# Patient Record
Sex: Female | Born: 1975 | Race: White | Hispanic: No | Marital: Married | Smoking: Never smoker
Health system: Southern US, Community
[De-identification: ages and names within clinical notes are randomized; demographics above are authoritative.]

## PROBLEM LIST (undated history)

## (undated) DIAGNOSIS — I10 Essential (primary) hypertension: Secondary | ICD-10-CM

## (undated) DIAGNOSIS — F419 Anxiety disorder, unspecified: Secondary | ICD-10-CM

## (undated) DIAGNOSIS — E0590 Thyrotoxicosis, unspecified without thyrotoxic crisis or storm: Secondary | ICD-10-CM

## (undated) DIAGNOSIS — K219 Gastro-esophageal reflux disease without esophagitis: Secondary | ICD-10-CM

## (undated) DIAGNOSIS — J0301 Acute recurrent streptococcal tonsillitis: Secondary | ICD-10-CM

## (undated) HISTORY — DX: Thyrotoxicosis, unspecified without thyrotoxic crisis or storm: E05.90

## (undated) HISTORY — DX: Anxiety disorder, unspecified: F41.9

## (undated) HISTORY — DX: Gastro-esophageal reflux disease without esophagitis: K21.9

---
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
## 1998-06-24 HISTORY — PX: DILATION AND CURETTAGE OF UTERUS: SHX78

## 2010-06-24 HISTORY — PX: OTHER SURGICAL HISTORY: SHX169

## 2013-11-17 ENCOUNTER — Encounter: Payer: Self-pay | Admitting: Family

## 2013-11-17 ENCOUNTER — Ambulatory Visit (INDEPENDENT_AMBULATORY_CARE_PROVIDER_SITE_OTHER): Payer: Managed Care, Other (non HMO) | Admitting: Family

## 2013-11-17 VITALS — BP 110/80 | HR 67 | Temp 98.2°F | Resp 16 | Ht <= 58 in | Wt 202.0 lb

## 2013-11-17 DIAGNOSIS — F40243 Fear of flying: Secondary | ICD-10-CM

## 2013-11-17 DIAGNOSIS — F40298 Other specified phobia: Secondary | ICD-10-CM

## 2013-11-17 MED ORDER — ALPRAZOLAM 0.25 MG PO TABS: ORAL_TABLET | ORAL | Status: DC

## 2013-11-17 NOTE — Assessment & Plan Note (Signed)
Well controlled with use of PRN xanax. Refill provided today. A controlled substance contract was reviewed and signed today.

## 2013-11-17 NOTE — Progress Notes (Signed)
   Subjective:    Patient ID: Daisey Must, female    DOB: September 11, 1975, 38 y.o.   MRN: [id number]  HPI  Ms. Torrez is a 38 yr old female who presents today to establish care. She recently moved to the area from [place]. 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 She has phobia of flying and uses xanax prior to long flights. Notes that if she is on a short flight, she does not always need to use xanax. She denies any issues with anxiety outside of flying. Denies hx of depression. Reports that her last physical was <1 year ago back in [place].   Review of Systems  Constitutional: Negative for unexpected weight change.  HENT: Negative for hearing loss and postnasal drip.   Eyes: Negative for visual disturbance.  Respiratory: Negative for cough and shortness of breath.   Cardiovascular: Negative for chest pain.  Gastrointestinal: Negative for vomiting, diarrhea and constipation.  Genitourinary: Negative for dysuria.       Mirena for bleeding issue  Skin: Negative for rash.  Neurological: Negative for headaches.  Hematological: Negative for adenopathy.  Psychiatric/Behavioral:       Denies depression   Past Medical History  Diagnosis Date  . Anxiety     History   Social History  . Marital Status: Married    Spouse Name: N/A    Number of Children: N/A  . Years of Education: N/A   Occupational History  . Not on file.   Social History Main Topics  . Smoking status: Never Smoker   . Smokeless tobacco: Never Used  . Alcohol Use: 1.0 oz/week    2 drink(s) per week  . Drug Use: Not on file  . Sexual Activity: Not on file   Other Topics Concern  . Not on file   Social History Narrative   Lives with husband   No children   Enjoys television, sporting events   Works as Charity fundraiser for [employer]   1 cat   Grew up in [place], has lived all over the country and also in [place] due to job moves.    Past Surgical History  Procedure Laterality Date  .  Wisdom tooth extraction  1995  . Dilation and curettage of uterus  2000  . Lasix  2012    History reviewed. No pertinent family history.  No Known Allergies  No current outpatient prescriptions on file prior to visit.   No current facility-administered medications on file prior to visit.    BP 110/80  Pulse 67  Temp(Src) 98.2 F (36.8 C) (Oral)  Resp 16  Ht 4\' 10"  (1.473 m)  Wt 202 lb (91.627 kg)  BMI 42.23 kg/m2  SpO2 99%       Objective:   Physical Exam        Assessment & Plan:

## 2013-11-17 NOTE — Progress Notes (Signed)
Pre visit review using our clinic review tool, if applicable. No additional management support is needed unless otherwise documented below in the visit note. 

## 2013-11-17 NOTE — Patient Instructions (Signed)
Please follow up in 6 months for annual physical. Welcome to [place]!

## 2014-05-01 ENCOUNTER — Telehealth: Payer: Self-pay | Admitting: Family

## 2014-05-02 NOTE — Telephone Encounter (Signed)
Last alprazolam Rx 11/17/13, #30. Pt is due for fasting physical at the end of November but has not scheduled yet.  Please advise.

## 2014-05-02 NOTE — Telephone Encounter (Signed)
Patient calling in requesting this. °

## 2014-05-02 NOTE — Telephone Encounter (Signed)
OK to send refill for 30 tabs.

## 2014-05-03 MED ORDER — ALPRAZOLAM 0.25 MG PO TABS: ORAL_TABLET | ORAL | Status: DC

## 2014-05-03 NOTE — Telephone Encounter (Signed)
Rx called to pharmacy voicemail. 

## 2014-08-15 ENCOUNTER — Telehealth: Payer: Self-pay | Admitting: Family

## 2014-08-15 MED ORDER — ALPRAZOLAM 0.25 MG PO TABS: ORAL_TABLET | ORAL | Status: DC

## 2014-08-15 NOTE — Telephone Encounter (Signed)
Rx called to pharmacy voicemail. 

## 2014-08-15 NOTE — Telephone Encounter (Signed)
Last Rx 05/03/14. Rx printed and forwarded to Provider for signature.

## 2014-08-15 NOTE — Telephone Encounter (Signed)
OK to send refill.  She is due for physical.  Please schedule.

## 2014-08-24 ENCOUNTER — Ambulatory Visit (INDEPENDENT_AMBULATORY_CARE_PROVIDER_SITE_OTHER): Payer: Managed Care, Other (non HMO) | Admitting: Family

## 2014-08-24 ENCOUNTER — Encounter: Payer: Self-pay | Admitting: Family

## 2014-08-24 VITALS — BP 110/80 | HR 92 | Temp 98.0°F | Resp 16 | Ht <= 58 in | Wt 213.6 lb

## 2014-08-24 DIAGNOSIS — K219 Gastro-esophageal reflux disease without esophagitis: Secondary | ICD-10-CM

## 2014-08-24 MED ORDER — RANITIDINE HCL 150 MG PO TABS: 150.0000 mg | ORAL_TABLET | Freq: Two times a day (BID) | ORAL | Status: DC

## 2014-08-24 NOTE — Patient Instructions (Addendum)
Start ranitidine, call if symptoms worsen or do not improve. Work on diet recommendations as below.  Gastroesophageal Reflux Disease, Adult Gastroesophageal reflux disease (GERD) happens when acid from your stomach flows up into the esophagus. When acid comes in contact with the esophagus, the acid causes soreness (inflammation) in the esophagus. Over time, GERD may create small holes (ulcers) in the lining of the esophagus. CAUSES   Increased body weight. This puts pressure on the stomach, making acid rise from the stomach into the esophagus.  Smoking. This increases acid production in the stomach.  Drinking alcohol. This causes decreased pressure in the lower esophageal sphincter (valve or ring of muscle between the esophagus and stomach), allowing acid from the stomach into the esophagus.  Late evening meals and a full stomach. This increases pressure and acid production in the stomach.  A malformed lower esophageal sphincter. Sometimes, no cause is found. SYMPTOMS   Burning pain in the lower part of the mid-chest behind the breastbone and in the mid-stomach area. This may occur twice a week or more often.  Trouble swallowing.  Sore throat.  Dry cough.  Asthma-like symptoms including chest tightness, shortness of breath, or wheezing. DIAGNOSIS  Your caregiver may be able to diagnose GERD based on your symptoms. In some cases, X-rays and other tests may be done to check for complications or to check the condition of your stomach and esophagus. TREATMENT  Your caregiver may recommend over-the-counter or prescription medicines to help decrease acid production. Ask your caregiver before starting or adding any new medicines.  HOME CARE INSTRUCTIONS   Change the factors that you can control. Ask your caregiver for guidance concerning weight loss, quitting smoking, and alcohol consumption.  Avoid foods and drinks that make your symptoms worse, such as:  Caffeine or alcoholic  drinks.  Chocolate.  Peppermint or mint flavorings.  Garlic and onions.  Spicy foods.  Citrus fruits, such as oranges, lemons, or limes.  Tomato-based foods such as sauce, chili, salsa, and pizza.  Fried and fatty foods.  Avoid lying down for the 3 hours prior to your bedtime or prior to taking a nap.  Eat small, frequent meals instead of large meals.  Wear loose-fitting clothing. Do not wear anything tight around your waist that causes pressure on your stomach.  Raise the head of your bed 6 to 8 inches with wood blocks to help you sleep. Extra pillows will not help.  Only take over-the-counter or prescription medicines for pain, discomfort, or fever as directed by your caregiver.  Do not take aspirin, ibuprofen, or other nonsteroidal anti-inflammatory drugs (NSAIDs). SEEK IMMEDIATE MEDICAL CARE IF:   You have pain in your arms, neck, jaw, teeth, or back.  Your pain increases or changes in intensity or duration.  You develop nausea, vomiting, or sweating (diaphoresis).  You develop shortness of breath, or you faint.  Your vomit is green, yellow, black, or looks like coffee grounds or blood.  Your stool is red, bloody, or black. These symptoms could be signs of other problems, such as heart disease, gastric bleeding, or esophageal bleeding. MAKE SURE YOU:   Understand these instructions.  Will watch your condition.  Will get help right away if you are not doing well or get worse. Document Released: 03/20/2005 Document Revised: 09/02/2011 Document Reviewed: 12/28/2010 [facility] Patient Information 2015 [place], [place]. This information is not intended to replace advice given to you by your health care provider. Make sure you discuss any questions you have with your health care  provider.  

## 2014-08-24 NOTE — Assessment & Plan Note (Signed)
Discussed dietary changes to help gerd. Trial of ranitidine. Discussed red flags such as black/blood stools or coffee ground emesis- she should let us know if these symptoms occur. Pt verbalizes understanding.

## 2014-08-24 NOTE — Progress Notes (Signed)
   Subjective:    Patient ID: Lori Stone, female    DOB: 02/26/1976, 39 y.o.   MRN: [id number]  HPI  GERD- sx x 3 weeks. Has chief complaint "burning sensation". Improved with tums.  + associated belching, mild dysphagia x 2 days. Denies associated cough, fever, chills, hematemesis, black/bloody stools.    Review of Systems See HPI  Past Medical History  Diagnosis Date  . Anxiety     History   Social History  . Marital Status: Married    Spouse Name: N/A  . Number of Children: N/A  . Years of Education: N/A   Occupational History  . Not on file.   Social History Main Topics  . Smoking status: Never Smoker   . Smokeless tobacco: Never Used  . Alcohol Use: 1.0 oz/week    2 drink(s) per week  . Drug Use: Not on file  . Sexual Activity: Not on file   Other Topics Concern  . 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    Past Surgical History  Procedure Laterality Date  . Wisdom tooth extraction  1995  . Dilation and curettage of uterus  2000  . Lasix  2012    History reviewed. No pertinent family history.  No Known Allergies  Current Outpatient Prescriptions on File Prior to Visit  Medication Sig Dispense Refill  . ALPRAZolam (XANAX) 0.25 MG tablet Take 1 tablet as needed before flying 30 tablet 0   No current facility-administered medications on file prior to visit.    BP 110/80 mmHg  Pulse 92  Temp(Src) 98 F (36.7 C) (Oral)  Resp 16  Ht 4\' 10"  (1.473 m)  Wt 213 lb 9.6 oz (96.888 kg)  BMI 44.65 kg/m2  SpO2 99%       Objective:   Physical Exam  Constitutional: She appears well-developed and well-nourished.  Cardiovascular: Normal rate, regular rhythm and normal heart sounds.   No murmur heard. Pulmonary/Chest: Effort normal and  breath sounds normal. No respiratory distress. She has no wheezes.  Abdominal: Soft. She exhibits no distension. There is no tenderness.  Psychiatric: She has a normal mood and affect. Her behavior is normal. Judgment and thought content normal.          Assessment & Plan:

## 2014-10-14 ENCOUNTER — Ambulatory Visit (INDEPENDENT_AMBULATORY_CARE_PROVIDER_SITE_OTHER): Payer: Managed Care, Other (non HMO) | Admitting: Family

## 2014-10-14 ENCOUNTER — Encounter: Payer: Self-pay | Admitting: Internal Medicine

## 2014-10-14 ENCOUNTER — Encounter: Payer: Self-pay | Admitting: Family

## 2014-10-14 VITALS — BP 116/85 | HR 69 | Temp 97.7°F | Resp 16 | Ht 67.75 in | Wt 217.0 lb

## 2014-10-14 DIAGNOSIS — K219 Gastro-esophageal reflux disease without esophagitis: Secondary | ICD-10-CM | POA: Diagnosis not present

## 2014-10-14 DIAGNOSIS — R140 Abdominal distension (gaseous): Secondary | ICD-10-CM | POA: Diagnosis not present

## 2014-10-14 NOTE — Patient Instructions (Signed)
Please complete lab work prior to leaving. You will be contact about your referral.  Please let us know if you have not heard back within 1 week about your referral.

## 2014-10-14 NOTE — Progress Notes (Signed)
   Subjective:    Patient ID: Lori Stone, female    DOB: 10/24/1975, 39 y.o.   MRN: [id number]  HPI  Lori Stone is a 39 yr old female who presents today to discuss celiac testing. Bloating, diarrhea, worse after breads and cheeses.  Mother and Earlie RavelingGreat Aunt (maternal) both have celiac disease.    Lori RifeGerd- notes some improvement in her gerd sxs with zantac.   Review of Systems See HPI  Past Medical History  Diagnosis Date  . Anxiety     History   Social History  . Marital Status: Married    Spouse Name: N/A  . Number of Children: N/A  . Years of Education: N/A   Occupational History  . Not on file.   Social History Main Topics  . Smoking status: Never Smoker   . Smokeless tobacco: Never Used  . Alcohol Use: 1.0 oz/week    2 drink(s) per week  . Drug Use: Not on file  . Sexual Activity: Not on file   Other Topics Concern  . 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    Past Surgical History  Procedure Laterality Date  . Wisdom tooth extraction  1995  . Dilation and curettage of uterus  2000  . Lasix  2012    No family history on file.  No Known Allergies  Current Outpatient Prescriptions on File Prior to Visit  Medication Sig Dispense Refill  . ALPRAZolam (XANAX) 0.25 MG tablet Take 1 tablet as needed before flying 30 tablet 0  . ranitidine (ZANTAC) 150 MG tablet Take 1 tablet (150 mg total) by mouth 2 (two) times daily. 60 tablet 2   No current facility-administered medications on file prior to visit.    BP 116/85 mmHg  Pulse 69  Temp(Src) 97.7 F (36.5 C) (Oral)  Resp 16  Ht 5' 7.75" (1.721 m)  Wt 217 lb (98.431 kg)  BMI 33.23 kg/m2  SpO2 99%       Objective:   Physical Exam  Constitutional: She is oriented to person, place, and  time. She appears well-developed and well-nourished.  Cardiovascular: Normal rate, regular rhythm and normal heart sounds.   No murmur heard. Pulmonary/Chest: Effort normal and breath sounds normal. No respiratory distress. She has no wheezes.  Abdominal: Soft. Bowel sounds are normal. She exhibits no distension. There is no tenderness. There is no rebound.  Neurological: She is alert and oriented to person, place, and time.  Psychiatric: She has a normal mood and affect. Her behavior is normal. Judgment and thought content normal.          Assessment & Plan:

## 2014-10-14 NOTE — Assessment & Plan Note (Signed)
Some improvement with zantac, continue same.

## 2014-10-14 NOTE — Assessment & Plan Note (Signed)
New. ? Celiac dz.  Pt desires testing. Obtain celiac panel, refer to GI for further evaluation due to gerd, bloating, diarrhea.

## 2014-10-14 NOTE — Progress Notes (Signed)
Pre visit review using our clinic review tool, if applicable. No additional management support is needed unless otherwise documented below in the visit note. 

## 2014-10-15 ENCOUNTER — Encounter: Payer: Self-pay | Admitting: Family

## 2014-10-16 ENCOUNTER — Encounter: Payer: Self-pay | Admitting: Family

## 2014-10-17 LAB — TISSUE TRANSGLUTAMINASE, IGA: Tissue Transglutaminase Ab, IgA: 1 U/mL (ref <4)

## 2014-10-17 LAB — GLIADIN ANTIBODIES, SERUM
Gliadin IgA: 9 Units (ref <20)
Gliadin IgG: 1 Units (ref <20)

## 2014-10-17 NOTE — Telephone Encounter (Signed)
Victorino DikeJennifer, could you please try [facility]. Elnoria HowardHung [facility] see if we can get her in sooner (see mychart note) If not, please notify pt.

## 2014-10-18 ENCOUNTER — Encounter: Payer: Self-pay | Admitting: Family

## 2014-10-18 LAB — RETICULIN ANTIBODIES, IGA W TITER: Reticulin Ab, IgA: NEGATIVE

## 2014-10-28 ENCOUNTER — Encounter: Payer: Self-pay | Admitting: Family

## 2014-11-02 ENCOUNTER — Ambulatory Visit: Payer: Managed Care, Other (non HMO) | Admitting: Physician Assistant

## 2014-11-02 ENCOUNTER — Encounter: Payer: Self-pay | Admitting: Gastroenterology

## 2014-11-04 ENCOUNTER — Encounter: Payer: Self-pay | Admitting: Physician Assistant

## 2014-11-04 ENCOUNTER — Ambulatory Visit (INDEPENDENT_AMBULATORY_CARE_PROVIDER_SITE_OTHER): Payer: Managed Care, Other (non HMO) | Admitting: Physician Assistant

## 2014-11-04 ENCOUNTER — Other Ambulatory Visit (INDEPENDENT_AMBULATORY_CARE_PROVIDER_SITE_OTHER): Payer: Managed Care, Other (non HMO)

## 2014-11-04 ENCOUNTER — Other Ambulatory Visit: Payer: Self-pay | Admitting: Family

## 2014-11-04 ENCOUNTER — Encounter: Payer: Self-pay | Admitting: Family

## 2014-11-04 VITALS — BP 118/78 | HR 66 | Ht 67.75 in | Wt 220.4 lb

## 2014-11-04 DIAGNOSIS — R1013 Epigastric pain: Secondary | ICD-10-CM | POA: Diagnosis not present

## 2014-11-04 DIAGNOSIS — R140 Abdominal distension (gaseous): Secondary | ICD-10-CM

## 2014-11-04 DIAGNOSIS — K219 Gastro-esophageal reflux disease without esophagitis: Secondary | ICD-10-CM

## 2014-11-04 DIAGNOSIS — R1310 Dysphagia, unspecified: Secondary | ICD-10-CM

## 2014-11-04 LAB — HEPATIC FUNCTION PANEL
ALT: 16 U/L (ref 0–35)
AST: 14 U/L (ref 0–37)
Albumin: 4.1 g/dL (ref 3.5–5.2)
Alkaline Phosphatase: 47 U/L (ref 39–117)
Bilirubin, Direct: 0.1 mg/dL (ref 0.0–0.3)
Total Bilirubin: 0.4 mg/dL (ref 0.2–1.2)
Total Protein: 7.2 g/dL (ref 6.0–8.3)

## 2014-11-04 LAB — CBC WITH DIFFERENTIAL/PLATELET
Basophils Absolute: 0 10*3/uL (ref 0.0–0.1)
Basophils Relative: 0.4 % (ref 0.0–3.0)
Eosinophils Absolute: 0.2 10*3/uL (ref 0.0–0.7)
Eosinophils Relative: 1.9 % (ref 0.0–5.0)
HCT: 42.1 % (ref 36.0–46.0)
Hemoglobin: 14.5 g/dL (ref 12.0–15.0)
Lymphocytes Relative: 33.4 % (ref 12.0–46.0)
Lymphs Abs: 3 10*3/uL (ref 0.7–4.0)
MCHC: 34.4 g/dL (ref 30.0–36.0)
MCV: 87 fl (ref 78.0–100.0)
Monocytes Absolute: 0.5 10*3/uL (ref 0.1–1.0)
Monocytes Relative: 5.4 % (ref 3.0–12.0)
Neutro Abs: 5.2 10*3/uL (ref 1.4–7.7)
Neutrophils Relative %: 58.9 % (ref 43.0–77.0)
Platelets: 258 10*3/uL (ref 150.0–400.0)
RBC: 4.84 Mil/uL (ref 3.87–5.11)
RDW: 13.4 % (ref 11.5–15.5)
WBC: 8.9 10*3/uL (ref 4.0–10.5)

## 2014-11-04 LAB — LIPASE: Lipase: 18 U/L (ref 11.0–59.0)

## 2014-11-04 MED ORDER — PANTOPRAZOLE SODIUM 40 MG PO TBEC: 40.0000 mg | DELAYED_RELEASE_TABLET | ORAL | Status: DC

## 2014-11-04 MED ORDER — RANITIDINE HCL 150 MG PO TABS: 150.0000 mg | ORAL_TABLET | Freq: Two times a day (BID) | ORAL | Status: DC

## 2014-11-04 NOTE — Patient Instructions (Addendum)
You have been scheduled for an abdominal ultrasound at [facility] (1st floor of hospital) on Thursday, 11/10/14 at 8:30 am. Please arrive 15 minutes prior to your appointment for registration. Make certain not to have anything to eat or drink 6 hours prior to your appointment. Should you need to reschedule your appointment, please contact radiology at [phone]. This test typically takes about 30 minutes to perform.  You have been scheduled for an endoscopy. Please follow written instructions given to you at your visit today. If you use inhalers (even only as needed), please bring them with you on the day of your procedure. Your physician has requested that you go to www.startemmi.com and enter the access code given to you at your visit today. This web site gives a general overview about your procedure. However, you should still follow specific instructions given to you by our office regarding your preparation for the procedure.  Your physician has requested that you go to the basement for the following lab work before leaving today: CBC, Lipase, Hepatic function panel  Patient advised to avoid spicy, acidic, citrus, chocolate, mints, fruit and fruit juices.  Limit the intake of caffeine, alcohol and Soda.  Don't exercise too soon after eating.  Don't lie down within 3-4 hours of eating.  Elevate the head of your bed.  Food Choices for Gastroesophageal Reflux Disease When you have gastroesophageal reflux disease (GERD), the foods you eat and your eating habits are very important. Choosing the right foods can help ease the discomfort of GERD. WHAT GENERAL GUIDELINES DO I NEED TO FOLLOW?  Choose fruits, vegetables, whole grains, low-fat dairy products, and low-fat meat, fish, and poultry.  Limit fats such as oils, salad dressings, butter, nuts, and avocado.  Keep a food diary to identify foods that cause symptoms.  Avoid foods that cause reflux. These may be different for different  people.  Eat frequent small meals instead of three large meals each day.  Eat your meals slowly, in a relaxed setting.  Limit fried foods.  Cook foods using methods other than frying.  Avoid drinking alcohol.  Avoid drinking large amounts of liquids with your meals.  Avoid bending over or lying down until 2-3 hours after eating. WHAT FOODS ARE NOT RECOMMENDED? The following are some foods and drinks that may worsen your symptoms: Vegetables Tomatoes. Tomato juice. Tomato and spaghetti sauce. Chili peppers. Onion and garlic. Horseradish. Fruits Oranges, grapefruit, and lemon (fruit and juice). Meats High-fat meats, fish, and poultry. This includes hot dogs, ribs, ham, sausage, salami, and bacon. Dairy Whole milk and chocolate milk. Sour cream. Cream. Butter. Ice cream. Cream cheese.  Beverages Coffee and tea, with or without caffeine. Carbonated beverages or energy drinks. Condiments Hot sauce. Barbecue sauce.  Sweets/Desserts Chocolate and cocoa. Donuts. Peppermint and spearmint. Fats and Oils High-fat foods, including [place]French fries and potato chips. Other Vinegar. Strong spices, such as black pepper, white pepper, red pepper, cayenne, curry powder, cloves, ginger, and chili powder. The items listed above may not be a complete list of foods and beverages to avoid. Contact your dietitian for more information. Document Released: 06/10/2005 Document Revised: 06/15/2013 Document Reviewed: 04/14/2013 [facility] Patient Information 2015 [place], [place]. This information is not intended to replace advice given to you by your health care provider. Make sure you discuss any questions you have with your health care provider.  Gastroesophageal Reflux Disease, Adult Gastroesophageal reflux disease (GERD) happens when acid from your stomach flows up into the esophagus. When acid comes in  contact with the esophagus, the acid causes soreness (inflammation) in the esophagus. Over time, GERD may  create small holes (ulcers) in the lining of the esophagus. CAUSES   Increased body weight. This puts pressure on the stomach, making acid rise from the stomach into the esophagus.  Smoking. This increases acid production in the stomach.  Drinking alcohol. This causes decreased pressure in the lower esophageal sphincter (valve or ring of muscle between the esophagus and stomach), allowing acid from the stomach into the esophagus.  Late evening meals and a full stomach. This increases pressure and acid production in the stomach.  A malformed lower esophageal sphincter. Sometimes, no cause is found. SYMPTOMS   Burning pain in the lower part of the mid-chest behind the breastbone and in the mid-stomach area. This may occur twice a week or more often.  Trouble swallowing.  Sore throat.  Dry cough.  Asthma-like symptoms including chest tightness, shortness of breath, or wheezing. DIAGNOSIS  Your caregiver may be able to diagnose GERD based on your symptoms. In some cases, X-rays and other tests may be done to check for complications or to check the condition of your stomach and esophagus. TREATMENT  Your caregiver may recommend over-the-counter or prescription medicines to help decrease acid production. Ask your caregiver before starting or adding any new medicines.  HOME CARE INSTRUCTIONS   Change the factors that you can control. Ask your caregiver for guidance concerning weight loss, quitting smoking, and alcohol consumption.  Avoid foods and drinks that make your symptoms worse, such as:  Caffeine or alcoholic drinks.  Chocolate.  Peppermint or mint flavorings.  Garlic and onions.  Spicy foods.  Citrus fruits, such as oranges, lemons, or limes.  Tomato-based foods such as sauce, chili, salsa, and pizza.  Fried and fatty foods.  Avoid lying down for the 3 hours prior to your bedtime or prior to taking a nap.  Eat small, frequent meals instead of large meals.  Wear  loose-fitting clothing. Do not wear anything tight around your waist that causes pressure on your stomach.  Raise the head of your bed 6 to 8 inches with wood blocks to help you sleep. Extra pillows will not help.  Only take over-the-counter or prescription medicines for pain, discomfort, or fever as directed by your caregiver.  Do not take aspirin, ibuprofen, or other nonsteroidal anti-inflammatory drugs (NSAIDs). SEEK IMMEDIATE MEDICAL CARE IF:   You have pain in your arms, neck, jaw, teeth, or back.  Your pain increases or changes in intensity or duration.  You develop nausea, vomiting, or sweating (diaphoresis).  You develop shortness of breath, or you faint.  Your vomit is green, yellow, black, or looks like coffee grounds or blood.  Your stool is red, bloody, or black. These symptoms could be signs of other problems, such as heart disease, gastric bleeding, or esophageal bleeding. MAKE SURE YOU:   Understand these instructions.  Will watch your condition.  Will get help right away if you are not doing well or get worse. Document Released: 03/20/2005 Document Revised: 09/02/2011 Document Reviewed: 12/28/2010 [facility] Patient Information 2015 [place], [place]. This information is not intended to replace advice given to you by your health care provider. Make sure you discuss any questions you have with your health care provider.

## 2014-11-04 NOTE — Progress Notes (Signed)
Patient ID: Lori Stone, female   DOB: 08/15/1975, 39 y.o.   MRN: [id number]    HPI:  Lori MustMarcia G Stone is a 39 y.o.   female referred by Lori Stone, [name], NP for evaluation of postprandial bloating, heartburn, and epigastric discomfort.  Lori Stone is a delightful 39 year old female who works in [employer] for [employer] when [employer] and frequently flies all over the country. Over the past year, she has been traveling much more frequently and has a severe read diet. Over the past 3-6 months she has started having heartburn on a daily basis with frequent belching and burping. She was given a trial of Zantac and states that provides minimal to moderate relief, but if she doesn't take it at all she will have a lot of heartburn. She has been getting nocturnal regurgitation and occasionally has a nocturnal cough. Her throat is very phlegmy in the morning and she has to clear her throat several times in the morning when she first gets up. She has had several episodes of dysphagia to solids and liquids though these are very infrequent. She feels the dysphagia to liquids is more bothersome. She has no globus sensation. She reports multiple episodes after meals with epigastric pain and distention where she feels a tightness around her rib cage it is not associated with nausea or vomiting. Often times she has to have an urgent bowel movement 30-60 minutes after meals. She has not noticed any difference with a limited eating dairy. Overall she's had no change in her bowel habits or stool caliber. She's had no bloody or tarry stools. She was evaluated by her primary care physician and had celiac blood work done which was negative. She states her great aunt on the maternal side has celiac disease and her mother had celiac disease and reports that their blood work was negative but they were diagnosed with celiac anyway. She is not sure if they had small bowel biopsies.   Past Medical History  Diagnosis Date  . Anxiety   . GERD  (gastroesophageal reflux disease)     Past Surgical History  Procedure Laterality Date  . Wisdom tooth extraction  1995  . Dilation and curettage of uterus  2000  . Lasix Bilateral 2012   Family History  Problem Relation Age of Onset  . Breast cancer Maternal Grandmother     dx in her late 10870's  . Ovarian cancer Mother     dx age 10466  . Celiac disease      mom's side, great aunt  . Celiac disease Mother   . Colon cancer Neg Hx    History  Substance Use Topics  . Smoking status: Never Smoker   . Smokeless tobacco: Never Used  . Alcohol Use: 1.2 oz/week    2 Standard drinks or equivalent per week     Comment: social   Current Outpatient Prescriptions  Medication Sig Dispense Refill  . ALPRAZolam (XANAX) 0.25 MG tablet Take 1 tablet as needed before flying 30 tablet 0  . ranitidine (ZANTAC) 150 MG tablet Take 1 tablet (150 mg total) by mouth 2 (two) times daily. 60 tablet 5  . pantoprazole (PROTONIX) 40 MG tablet Take 1 tablet (40 mg total) by mouth every morning. 30 minutes prior to breakfast 30 tablet 6   No current facility-administered medications for this visit.   No Known Allergies   Review of Systems: Gen: Denies any fever, chills, sweats, anorexia, fatigue, weakness, malaise, weight loss, and sleep disorder CV: Denies chest  pain, angina, palpitations, syncope, orthopnea, PND, peripheral edema, and claudication. Resp: Denies dyspnea at rest, dyspnea with exercise, cough, sputum, wheezing, coughing up blood, and pleurisy. GI: Denies vomiting blood, jaundice, and fecal incontinence.  Has intermittent dysphagia to solids and liquids GU : Denies urinary burning, blood in urine, urinary frequency, urinary hesitancy, nocturnal urination, and urinary incontinence. MS: Denies joint pain, limitation of movement, and swelling, stiffness, low back pain, extremity pain. Denies muscle weakness, cramps, atrophy.  Derm: Denies rash, itching, dry skin, hives, moles, warts, or  unhealing ulcers.  Psych: Denies depression, anxiety, memory loss, suicidal ideation, hallucinations, paranoia, and confusion. Heme: Denies bruising, bleeding, and enlarged lymph nodes. Neuro:  Denies any headaches, dizziness, paresthesias. Endo:  Denies any problems with DM, thyroid, adrenal function   LAB RESULTS: Blood work on 10/14/2014 bleed in IgG 1, Gliadel IgA 9, tissue transglutaminase antibody IgA 1, reticulin antibody IgA negative    Physical Exam: BP 118/78 mmHg  Pulse 66  Ht 5' 7.75" (1.721 m)  Wt 220 lb 6.4 oz (99.973 kg)  BMI 33.75 kg/m2 Constitutional: Pleasant,well-developed female in no acute distress. HEENT: Normocephalic and atraumatic. Conjunctivae are normal. No scleral icterus. Neck supple. No cervical adenopathy  Cardiovascular: Normal rate, regular rhythm.  Pulmonary/chest: Effort normal and breath sounds normal. No wheezing, rales or rhonchi. Abdominal: Soft, nondistended, nontender. Bowel sounds active throughout. There are no masses palpable. No hepatomegaly. Extremities: no edema Lymphadenopathy: No cervical adenopathy noted. Neurological: Alert and oriented to person place and time. Skin: Skin is warm and dry. No rashes noted. Psychiatric: Normal mood and affect. Behavior is normal.  ASSESSMENT AND PLAN: 39 year old female with a one-year history of frequent heartburn, intermittent dysphagia, postprandial bloating and epigastric discomfort and occasional postprandial diarrhea referred for evaluation. Symptoms are largely functional in nature. An antireflux regimen has been reviewed, and she will be given a trial of pantoprazole 40 mg by mouth every morning 30 minutes prior to breakfast. As she has been having some epigastric discomfort and intermittent dysphagia and is concerned about the possibility of celiac disease, she will be scheduled for an EGD to evaluate for esophagitis, stricture, gastritis, ulcers, as well as to obtain small bowel biopsies to  evaluate for sprue.The risks, benefits, and alternatives to endoscopy with possible biopsy and possible dilation were discussed with the patient and they consent to proceed. A hepatic function panel and lipase will be obtained along with an abdominal ultrasound to evaluate gallbladder. Follow-up recommendations will be made pending the findings of the above.    Isaic Syler, Tollie PizzaLori P PA-C 11/04/2014, 1:25 PM  CC: Lori Stone, [name], NP

## 2014-11-04 NOTE — Progress Notes (Signed)
i agree with the above note, plan 

## 2014-11-07 MED ORDER — ALPRAZOLAM 0.25 MG PO TABS: ORAL_TABLET | ORAL | Status: DC

## 2014-11-07 NOTE — Telephone Encounter (Signed)
Pt is correct. Last alprazolam rx was 2/22; not 10/14/14. Rx printed and forwarded to Provider for signature.

## 2014-11-08 ENCOUNTER — Encounter: Payer: Self-pay | Admitting: Gastroenterology

## 2014-11-08 NOTE — Telephone Encounter (Signed)
Rx was faxed to pharmacy on 11/07/14 at 2:20pm.

## 2014-11-10 ENCOUNTER — Other Ambulatory Visit: Payer: Self-pay | Admitting: Physician Assistant

## 2014-11-10 ENCOUNTER — Ambulatory Visit (HOSPITAL_COMMUNITY)
Admission: RE | Admit: 2014-11-10 | Discharge: 2014-11-10 | Disposition: A | Discharge status: Home / Self Care | Payer: Managed Care, Other (non HMO) | Source: Ambulatory Visit | Attending: Physician Assistant | Admitting: Physician Assistant

## 2014-11-10 DIAGNOSIS — R1013 Epigastric pain: Secondary | ICD-10-CM

## 2014-11-10 DIAGNOSIS — R140 Abdominal distension (gaseous): Secondary | ICD-10-CM

## 2014-11-10 DIAGNOSIS — R109 Unspecified abdominal pain: Secondary | ICD-10-CM | POA: Diagnosis not present

## 2014-11-10 DIAGNOSIS — K219 Gastro-esophageal reflux disease without esophagitis: Secondary | ICD-10-CM

## 2014-11-10 DIAGNOSIS — R1310 Dysphagia, unspecified: Secondary | ICD-10-CM

## 2014-11-16 ENCOUNTER — Encounter: Payer: Self-pay | Admitting: Gastroenterology

## 2014-11-16 ENCOUNTER — Ambulatory Visit (AMBULATORY_SURGERY_CENTER): Payer: Managed Care, Other (non HMO) | Admitting: Gastroenterology

## 2014-11-16 VITALS — BP 144/84 | HR 66 | Temp 98.1°F | Resp 22 | Ht 67.75 in | Wt 220.0 lb

## 2014-11-16 DIAGNOSIS — K2950 Unspecified chronic gastritis without bleeding: Secondary | ICD-10-CM | POA: Diagnosis not present

## 2014-11-16 DIAGNOSIS — R1013 Epigastric pain: Secondary | ICD-10-CM | POA: Diagnosis not present

## 2014-11-16 DIAGNOSIS — R1310 Dysphagia, unspecified: Secondary | ICD-10-CM | POA: Diagnosis not present

## 2014-11-16 DIAGNOSIS — K2970 Gastritis, unspecified, without bleeding: Secondary | ICD-10-CM | POA: Diagnosis not present

## 2014-11-16 DIAGNOSIS — K2990 Gastroduodenitis, unspecified, without bleeding: Secondary | ICD-10-CM | POA: Diagnosis not present

## 2014-11-16 MED ORDER — SODIUM CHLORIDE 0.9 % IV SOLN: 500.0000 mL | INTRAVENOUS | Status: DC

## 2014-11-16 NOTE — Progress Notes (Signed)
Report to PACU, RN, vss, BBS= Clear.  

## 2014-11-16 NOTE — Progress Notes (Signed)
Called to room to assist during endoscopic procedure.  Patient ID and intended procedure confirmed with present staff. Received instructions for my participation in the procedure from the performing physician.  

## 2014-11-16 NOTE — Op Note (Signed)
[facility] [address] [place], [id number]   ENDOSCOPY PROCEDURE REPORT  PATIENT: Daisey MustKing, Lori G  MR#: [id number] BIRTHDATE: April 12, 1976 , 39  yrs. old GENDER: female ENDOSCOPIST: Rachael Feeaniel P Jacobs, MD REFERRED BY:  Sandford CrazeMelissa O'Sullivan, FNP PROCEDURE DATE:  11/16/2014 PROCEDURE:  EGD w/ biopsy ASA CLASS:     Class II INDICATIONS:  dyspepsia, reflux, intermittent dysphgia. MEDICATIONS: Monitored anesthesia care and Propofol 200 mg IV TOPICAL ANESTHETIC: none  DESCRIPTION OF PROCEDURE: After the risks benefits and alternatives of the procedure were thoroughly explained, informed consent was obtained.  The LB [id number] endoscope was introduced through the mouth and advanced to the second portion of the duodenum , Without limitations.  The instrument was slowly withdrawn as the mucosa was fully examined.  There was mild, non-specific distal gastritis.  This was biopsied and sent to pathology.  The examination was otherwise normal. Retroflexed views revealed no abnormalities.     The scope was then withdrawn from the patient and the procedure completed. COMPLICATIONS: There were no immediate complications.  ENDOSCOPIC IMPRESSION: There was mild, non-specific distal gastritis.  This was biopsied and sent to pathology.  The examination was otherwise normal  RECOMMENDATIONS: If biopsies show H.  pylori, you will be started on appropriate antibiotics.  Please continue protonix once daily for now, in 1 month try to cut back to every other day dosing for 2-3 weeks and then as needed only dosing.   eSigned:  Rachael Feeaniel P Jacobs, MD 11/16/2014 9:48 AM

## 2014-11-16 NOTE — Patient Instructions (Signed)
Discharge instructions given. Biopsies taken. Resume previous medications. YOU HAD AN ENDOSCOPIC PROCEDURE TODAY AT THE [facility]:   Refer to the procedure report that was given to you for any specific questions about what was found during the examination.  If the procedure report does not answer your questions, please call your gastroenterologist to clarify.  If you requested that your care partner not be given the details of your procedure findings, then the procedure report has been included in a sealed envelope for you to review at your convenience later.  YOU SHOULD EXPECT: Some feelings of bloating in the abdomen. Passage of more gas than usual.  Walking can help get rid of the air that was put into your GI tract during the procedure and reduce the bloating. If you had a lower endoscopy (such as a colonoscopy or flexible sigmoidoscopy) you may notice spotting of blood in your stool or on the toilet paper. If you underwent a bowel prep for your procedure, you may not have a normal bowel movement for a few days.  Please Note:  You might notice some irritation and congestion in your nose or some drainage.  This is from the oxygen used during your procedure.  There is no need for concern and it should clear up in a day or so.  SYMPTOMS TO REPORT IMMEDIATELY:   Following upper endoscopy (EGD)  Vomiting of blood or coffee ground material  New chest pain or pain under the shoulder blades  Painful or persistently difficult swallowing  New shortness of breath  Fever of 100F or higher  Black, tarry-looking stools  For urgent or emergent issues, a gastroenterologist can be reached at any hour by calling [phone].   DIET: Your first meal following the procedure should be a small meal and then it is ok to progress to your normal diet. Heavy or fried foods are harder to digest and may make you feel nauseous or bloated.  Likewise, meals heavy in dairy and vegetables can increase  bloating.  Drink plenty of fluids but you should avoid alcoholic beverages for 24 hours.  ACTIVITY:  You should plan to take it easy for the rest of today and you should NOT DRIVE or use heavy machinery until tomorrow (because of the sedation medicines used during the test).    FOLLOW UP: Our staff will call the number listed on your records the next business day following your procedure to check on you and address any questions or concerns that you may have regarding the information given to you following your procedure. If we do not reach you, we will leave a message.  However, if you are feeling well and you are not experiencing any problems, there is no need to return our call.  We will assume that you have returned to your regular daily activities without incident.  If any biopsies were taken you will be contacted by phone or by letter within the next 1-3 weeks.  Please call us at [phone] if you have not heard about the biopsies in 3 weeks.    SIGNATURES/CONFIDENTIALITY: You and/or your care partner have signed paperwork which will be entered into your electronic medical record.  These signatures attest to the fact that that the information above on your After Visit Summary has been reviewed and is understood.  Full responsibility of the confidentiality of this discharge information lies with you and/or your care-partner. 

## 2014-11-17 ENCOUNTER — Telehealth: Payer: Self-pay | Admitting: *Deleted

## 2014-11-17 NOTE — Telephone Encounter (Signed)
  Follow up Call-  Call back number 11/16/2014  Post procedure Call Back phone  # [phone]  Permission to leave phone message Yes     No answer at # given.  Left message on voicemail.

## 2014-11-23 ENCOUNTER — Encounter: Payer: Self-pay | Admitting: Gastroenterology

## 2014-11-27 ENCOUNTER — Encounter: Payer: Self-pay | Admitting: Physician Assistant

## 2014-12-08 ENCOUNTER — Ambulatory Visit: Payer: Managed Care, Other (non HMO) | Admitting: Internal Medicine

## 2014-12-24 ENCOUNTER — Encounter: Payer: Self-pay | Admitting: Physician Assistant

## 2014-12-27 ENCOUNTER — Other Ambulatory Visit: Payer: Self-pay | Admitting: *Deleted

## 2014-12-27 MED ORDER — PANTOPRAZOLE SODIUM 40 MG PO TBEC: 40.0000 mg | DELAYED_RELEASE_TABLET | ORAL | Status: DC

## 2015-02-03 ENCOUNTER — Ambulatory Visit (INDEPENDENT_AMBULATORY_CARE_PROVIDER_SITE_OTHER): Payer: Managed Care, Other (non HMO) | Admitting: Podiatry

## 2015-02-03 ENCOUNTER — Encounter: Payer: Self-pay | Admitting: Podiatry

## 2015-02-03 VITALS — BP 138/83 | HR 73 | Ht 68.0 in | Wt 210.0 lb

## 2015-02-03 DIAGNOSIS — M216X9 Other acquired deformities of unspecified foot: Secondary | ICD-10-CM | POA: Diagnosis not present

## 2015-02-03 DIAGNOSIS — M216X1 Other acquired deformities of right foot: Secondary | ICD-10-CM

## 2015-02-03 DIAGNOSIS — M21969 Unspecified acquired deformity of unspecified lower leg: Secondary | ICD-10-CM

## 2015-02-03 DIAGNOSIS — M7660 Achilles tendinitis, unspecified leg: Secondary | ICD-10-CM | POA: Diagnosis not present

## 2015-02-03 DIAGNOSIS — M7661 Achilles tendinitis, right leg: Secondary | ICD-10-CM | POA: Insufficient documentation

## 2015-02-03 NOTE — Patient Instructions (Signed)
Pain in right calf following exercise. Casted for orthotics.

## 2015-02-03 NOTE — Progress Notes (Signed)
SUBJECTIVE: 39 y.o. year old female presents requesting custom orthotics. She has had orthotics two hears ago for plantar fasciitis right foot and the orthotics are worn out and still pain in back of right heel after been on feet. She does desk job.   OBJECTIVE: DERMATOLOGIC EXAMINATION: No abnormal findings.  VASCULAR EXAMINATION OF LOWER LIMBS: Pedal pulses: All pedal pulses are palpable with normal pulsation.  NEUROLOGIC EXAMINATION OF THE LOWER LIMBS: All epicritic and tactile sensations are grossly intact.  MUSCULOSKELETAL EXAMINATION: Positive of hypermobile first ray L>R, forefoot varus, and compensatory STJ hyperpronation bilateral. Tight Achilles tendon right foot.   RADIOGRAPHIC STUDIES:  Findings reveal short first ray with rectus foot in AP vew. Lateral view shoe high arched cavus type foot with elevated first metatarsal bone bilateral. No other acute changes noted. All osseous and articular surfaces are within normal.  ASSESSMENT: Metatarsal deformity. Hyperpronation STJ bilateral. 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  PLAN: Reviewed clinical findings and available treatment options. Reviewed Achilles tendon stretch exercise. Both feet casted for orthotics.

## 2015-04-11 ENCOUNTER — Encounter: Payer: Self-pay | Admitting: Family

## 2015-04-15 ENCOUNTER — Encounter: Payer: Self-pay | Admitting: Family

## 2015-05-08 ENCOUNTER — Other Ambulatory Visit: Payer: Self-pay | Admitting: Family

## 2015-05-08 NOTE — Telephone Encounter (Signed)
Last Alprazolam Rx 11/07/14, #30. Pt last seen 11/04/14 and has not future appts scheduled.  Please advise refill and next follow up?

## 2015-05-08 NOTE — Telephone Encounter (Signed)
Ok to send 30 tabs but pt is due for follow up prior to additional refills.

## 2015-05-09 MED ORDER — ALPRAZOLAM 0.25 MG PO TABS: ORAL_TABLET | ORAL | Status: DC

## 2015-05-09 NOTE — Telephone Encounter (Signed)
Refill called to pharmacy voicemail and message sent to pt via mychart.

## 2015-06-05 ENCOUNTER — Ambulatory Visit (INDEPENDENT_AMBULATORY_CARE_PROVIDER_SITE_OTHER): Payer: Managed Care, Other (non HMO) | Admitting: Family

## 2015-06-05 ENCOUNTER — Encounter: Payer: Self-pay | Admitting: Family

## 2015-06-05 ENCOUNTER — Other Ambulatory Visit: Payer: Managed Care, Other (non HMO)

## 2015-06-05 VITALS — BP 126/76 | HR 78 | Temp 97.7°F | Resp 16 | Ht 68.0 in | Wt 224.4 lb

## 2015-06-05 DIAGNOSIS — F40243 Fear of flying: Secondary | ICD-10-CM

## 2015-06-05 DIAGNOSIS — K219 Gastro-esophageal reflux disease without esophagitis: Secondary | ICD-10-CM

## 2015-06-05 NOTE — Assessment & Plan Note (Signed)
Continue prn xanax. Controlled substance contract is signed.  She will return for UDS.

## 2015-06-05 NOTE — Patient Instructions (Addendum)
Please schedule a lab visit for UDS. Schedule a complete physical at the front desk.

## 2015-06-05 NOTE — Assessment & Plan Note (Signed)
Stable with every other day protonix.

## 2015-06-05 NOTE — Progress Notes (Signed)
Pre visit review using our clinic review tool, if applicable. No additional management support is needed unless otherwise documented below in the visit note. 

## 2015-06-05 NOTE — Progress Notes (Signed)
   Subjective:    Patient ID: Lori Stone, female    DOB: 11/03/1975, 39 y.o.   MRN: [id number]  HPI  Lori Stone is a 39 year old female who presents today for follow up.    GERD-  Using protonix QOD,  Reports symptoms are well controlled.   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 Reports + anxiety with flying. Reports that the takes xanax prior to flight and this really helps her nervousness. Just urinated, unable to provide urine sample this AM.    Review of Systems See HPI  Past Medical History  Diagnosis Date  . Anxiety   . GERD (gastroesophageal reflux disease)     Social History   Social History  . Marital Status: Married    Spouse Name: N/A  . Number of Children: 0  . Years of Education: N/A   Occupational History  . chemist    Social History Main Topics  . Smoking status: Never Smoker   . Smokeless tobacco: Never Used  . Alcohol Use: 1.2 oz/week    2 Standard drinks or equivalent per week     Comment: social  . Drug Use: No  . Sexual Activity: Not on file   Other Topics Concern  . 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    Past Surgical History  Procedure Laterality Date  . Wisdom tooth extraction  1995  . Dilation and curettage of uterus  2000  . Lasix Bilateral 2012    Family History  Problem Relation Age of Onset  . Breast cancer Maternal Grandmother     dx in her late 6670's  . Ovarian cancer Mother     dx age 40  . Celiac disease      mom's side, great aunt  . Celiac disease Mother   . Colon cancer Neg Hx     No Known Allergies  Current Outpatient Prescriptions on File Prior to Visit  Medication Sig Dispense Refill  . ALPRAZolam (XANAX) 0.25 MG tablet Take 1 tablet as needed before flying 30 tablet 0  .  pantoprazole (PROTONIX) 40 MG tablet Take 1 tablet (40 mg total) by mouth every morning. 30 minutes prior to breakfast 90 tablet 3   No current facility-administered medications on file prior to visit.    BP 126/76 mmHg  Pulse 78  Temp(Src) 97.7 F (36.5 C) (Oral)  Resp 16  Ht 5\' 8"  (1.727 m)  Wt 224 lb 6.4 oz (101.787 kg)  BMI 34.13 kg/m2       Objective:   Physical Exam  Constitutional: She appears well-developed and well-nourished.  Cardiovascular: Normal rate, regular rhythm and normal heart sounds.   No murmur heard. Pulmonary/Chest: Effort normal and breath sounds normal. No respiratory distress. She has no wheezes.  Musculoskeletal: She exhibits no edema.  Psychiatric: She has a normal mood and affect. Her behavior is normal. Judgment and thought content normal.          Assessment & Plan:

## 2015-06-26 ENCOUNTER — Encounter: Payer: Self-pay | Admitting: Family

## 2015-09-01 ENCOUNTER — Telehealth: Payer: Self-pay | Admitting: Family

## 2015-09-01 MED ORDER — ALPRAZOLAM 0.25 MG PO TABS: ORAL_TABLET | ORAL | Status: DC

## 2015-09-01 NOTE — Telephone Encounter (Signed)
Pt is requesting refill on Alprazolam.  Last OV: 06/05/2015 Last Fill: 05/09/2015 #30 and 0RF UDS: 06/05/2015 Low risk  Please advise.

## 2015-09-01 NOTE — Telephone Encounter (Signed)
See rx. 

## 2015-12-21 ENCOUNTER — Ambulatory Visit (INDEPENDENT_AMBULATORY_CARE_PROVIDER_SITE_OTHER): Payer: Managed Care, Other (non HMO) | Admitting: Family

## 2015-12-21 ENCOUNTER — Encounter: Payer: Self-pay | Admitting: Family

## 2015-12-21 VITALS — BP 120/86 | HR 70 | Temp 98.6°F | Resp 18 | Ht 68.0 in | Wt 216.4 lb

## 2015-12-21 DIAGNOSIS — F40243 Fear of flying: Secondary | ICD-10-CM

## 2015-12-21 DIAGNOSIS — I10 Essential (primary) hypertension: Secondary | ICD-10-CM

## 2015-12-21 NOTE — Progress Notes (Signed)
Pre visit review using our clinic review tool, if applicable. No additional management support is needed unless otherwise documented below in the visit note. 

## 2015-12-21 NOTE — Progress Notes (Signed)
   Subjective:    Patient ID: Lori Stone, female    DOB: 06/23/1976, 40 y.o.   MRN: [id number]  HPI    Review of Systems     Objective:   Physical Exam        Assessment & Plan:  Patient left before being seen at 8:13 stating that she had to go to work and could no longer wait.  Provider was about to begin visit.

## 2015-12-27 ENCOUNTER — Telehealth: Payer: Self-pay | Admitting: *Deleted

## 2015-12-27 MED ORDER — ALPRAZOLAM 0.25 MG PO TABS: ORAL_TABLET | ORAL | Status: DC

## 2015-12-27 NOTE — Telephone Encounter (Signed)
Per verbal from PCP, ok to call in refill of pt's alprazolam since pt had to leave before being seen at last visit. Refill left on pharmacy voicemail and message sent to pt.

## 2016-02-06 ENCOUNTER — Encounter: Payer: Self-pay | Admitting: Family

## 2016-05-01 ENCOUNTER — Ambulatory Visit (INDEPENDENT_AMBULATORY_CARE_PROVIDER_SITE_OTHER): Payer: Managed Care, Other (non HMO) | Admitting: Podiatry

## 2016-05-01 ENCOUNTER — Encounter: Payer: Self-pay | Admitting: Podiatry

## 2016-05-01 VITALS — Ht 68.0 in | Wt 200.0 lb

## 2016-05-01 DIAGNOSIS — M216X2 Other acquired deformities of left foot: Secondary | ICD-10-CM

## 2016-05-01 DIAGNOSIS — M216X1 Other acquired deformities of right foot: Secondary | ICD-10-CM | POA: Diagnosis not present

## 2016-05-01 DIAGNOSIS — M216X9 Other acquired deformities of unspecified foot: Secondary | ICD-10-CM

## 2016-05-01 DIAGNOSIS — M21969 Unspecified acquired deformity of unspecified lower leg: Secondary | ICD-10-CM | POA: Diagnosis not present

## 2016-05-01 NOTE — Patient Instructions (Signed)
Need a new pair orthotics to replace old pair. Will use existing info in file. Will call when they are ready.

## 2016-05-01 NOTE — Progress Notes (Signed)
Subjective: Patient wants to have a new pair orthotics. Existing orthotics have worn out. She was satisfied with her orthotics and her feet are doing well. Will use existing file to order new pair.  OBJECTIVE: DERMATOLOGIC EXAMINATION: No abnormal findings.  VASCULAR EXAMINATION OF LOWER LIMBS: Pedal pulses: All pedal pulses are palpable with normal pulsation.  NEUROLOGIC EXAMINATION OF THE LOWER LIMBS: All epicritic and tactile sensations are grossly intact.  MUSCULOSKELETAL EXAMINATION: Positive of hypermobile first ray L>R, forefoot varus, and compensatory STJ hyperpronation bilateral. Tight Achilles tendon right foot.   X-ray result record indicate  Findings reveal short first ray with rectus foot in AP vew. Lateral view shoe high arched cavus type foot with elevated first metatarsal bone bilateral. No other acute changes noted. All osseous and articular surfaces are within normal.  ASSESSMENT: Metatarsal deformity. Hyperpronation STJ bilateral. 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  Plan: Will call lab to duplicate previous orthotic in file.

## 2016-05-16 ENCOUNTER — Other Ambulatory Visit: Payer: Self-pay | Admitting: Family

## 2016-05-16 ENCOUNTER — Other Ambulatory Visit: Payer: Self-pay | Admitting: Gastroenterology

## 2016-05-20 MED ORDER — ALPRAZOLAM 0.25 MG PO TABS: ORAL_TABLET | ORAL | 0 refills | Status: DC

## 2016-05-20 NOTE — Telephone Encounter (Signed)
Rx faxed to pharmacy  

## 2016-05-20 NOTE — Telephone Encounter (Signed)
Pt is requesting refill on Alprazolam.  Last OV: 12/21/2015 Last Fill: 12/27/2015 #30 and 0RF UDS: 12/21/2015 Moderate risk  Please advise.

## 2016-07-04 DIAGNOSIS — M722 Plantar fascial fibromatosis: Secondary | ICD-10-CM

## 2016-08-16 ENCOUNTER — Telehealth: Payer: Self-pay | Admitting: Family

## 2016-08-16 ENCOUNTER — Telehealth: Payer: Self-pay | Admitting: *Deleted

## 2016-08-16 ENCOUNTER — Encounter: Payer: Self-pay | Admitting: Family

## 2016-08-16 ENCOUNTER — Ambulatory Visit (INDEPENDENT_AMBULATORY_CARE_PROVIDER_SITE_OTHER): Payer: 59 | Admitting: Family

## 2016-08-16 ENCOUNTER — Ambulatory Visit (HOSPITAL_BASED_OUTPATIENT_CLINIC_OR_DEPARTMENT_OTHER)
Admission: RE | Admit: 2016-08-16 | Discharge: 2016-08-16 | Disposition: A | Discharge status: Home / Self Care | Payer: 59 | Source: Ambulatory Visit | Attending: Family | Admitting: Family

## 2016-08-16 VITALS — BP 123/67 | HR 86 | Temp 98.1°F | Ht 68.0 in | Wt 203.6 lb

## 2016-08-16 DIAGNOSIS — D649 Anemia, unspecified: Secondary | ICD-10-CM

## 2016-08-16 DIAGNOSIS — R0602 Shortness of breath: Secondary | ICD-10-CM

## 2016-08-16 DIAGNOSIS — R7989 Other specified abnormal findings of blood chemistry: Secondary | ICD-10-CM | POA: Insufficient documentation

## 2016-08-16 LAB — CBC WITH DIFFERENTIAL/PLATELET
Basophils Absolute: 0 10*3/uL (ref 0.0–0.1)
Basophils Relative: 0.3 % (ref 0.0–3.0)
Eosinophils Absolute: 0.1 10*3/uL (ref 0.0–0.7)
Eosinophils Relative: 2.2 % (ref 0.0–5.0)
HCT: 35.5 % — ABNORMAL LOW (ref 36.0–46.0)
Hemoglobin: 11.9 g/dL — ABNORMAL LOW (ref 12.0–15.0)
Lymphocytes Relative: 33.9 % (ref 12.0–46.0)
Lymphs Abs: 1.7 10*3/uL (ref 0.7–4.0)
MCHC: 33.6 g/dL (ref 30.0–36.0)
MCV: 86.5 fl (ref 78.0–100.0)
Monocytes Absolute: 0.5 10*3/uL (ref 0.1–1.0)
Monocytes Relative: 10 % (ref 3.0–12.0)
Neutro Abs: 2.7 10*3/uL (ref 1.4–7.7)
Neutrophils Relative %: 53.6 % (ref 43.0–77.0)
Platelets: 235 10*3/uL (ref 150.0–400.0)
RBC: 4.11 Mil/uL (ref 3.87–5.11)
RDW: 12.4 % (ref 11.5–15.5)
WBC: 5 10*3/uL (ref 4.0–10.5)

## 2016-08-16 LAB — BASIC METABOLIC PANEL
BUN: 10 mg/dL (ref 6–23)
CO2: 25 mEq/L (ref 19–32)
Calcium: 8.9 mg/dL (ref 8.4–10.5)
Chloride: 108 mEq/L (ref 96–112)
Creatinine, Ser: 0.62 mg/dL (ref 0.40–1.20)
GFR: 112.12 mL/min (ref >60.00)
Glucose, Bld: 93 mg/dL (ref 70–99)
Potassium: 3.6 mEq/L (ref 3.5–5.1)
Sodium: 140 mEq/L (ref 135–145)

## 2016-08-16 LAB — POCT URINE PREGNANCY: Preg Test, Ur: NEGATIVE

## 2016-08-16 LAB — D-DIMER, QUANTITATIVE: D-Dimer, Quant: 0.52 mcg/mL FEU — ABNORMAL HIGH (ref <0.50)

## 2016-08-16 MED ORDER — IOPAMIDOL (ISOVUE-370) INJECTION 76%
100.0000 mL | Freq: Once | INTRAVENOUS | Status: AC | PRN
  Administered 2016-08-16: 100 mL via INTRAVENOUS

## 2016-08-16 NOTE — Telephone Encounter (Signed)
Spoke with radiology and was told to have pt come in at 3pm. Will give IFOB to radiology to give to pt. Left message for pt to return my call ASAP. IFOB order entered and kit given to radiology. Pt has been notified.

## 2016-08-16 NOTE — Progress Notes (Signed)
Subjective:    Patient ID: Lori Stone, female    DOB: 07/04/1975, 41 y.o.   MRN: [id number]  HPI  Returned from [place] on 1/28. For 4 days had nausea/vomitting following her return.  Nausea/vomitting has since resolved but she reports a 2 week hx of SOB with exertion.  Denies calf pain/swelling or chest pain.    Review of Systems    see HPI  Past Medical History:  Diagnosis Date  . Anxiety   . GERD (gastroesophageal reflux disease)      Social History   Social History  . Marital status: Married    Spouse name: N/A  . Number of children: 0  . Years of education: N/A   Occupational History  . chemist    Social History Main Topics  . Smoking status: Never Smoker  . Smokeless tobacco: Never Used  . Alcohol use 1.2 oz/week    2 Standard drinks or equivalent per week     Comment: social  . Drug use: No  . Sexual activity: Not on file   Other Topics Concern  . Not on file   Social History Narrative   Lives with husband   No children   Enjoys television, sporting events   Works as Charity fundraiser for [employer]   1 cat   Grew up in [place], has lived all over the country and also in [place] due to job moves.    Past Surgical History:  Procedure Laterality Date  . DILATION AND CURETTAGE OF UTERUS  2000  . lasix Bilateral 2012  . WISDOM TOOTH EXTRACTION  1995    Family History  Problem Relation Age of Onset  . Breast cancer Maternal Grandmother     dx in her late 85's  . Ovarian cancer Mother     dx age 61  . Celiac disease      mom's side, great aunt  . Celiac disease Mother   . Colon cancer Neg Hx     No Known Allergies  Current Outpatient Prescriptions on File Prior to Visit  Medication Sig Dispense Refill  . ALPRAZolam (XANAX) 0.25 MG tablet Take 1 tablet as needed before flying 30 tablet 0  . pantoprazole (PROTONIX) 40 MG tablet TAKE 1 TABLET BY MOUTH EVERY MORNING 30 MINUTES PRIOR TO BREAKFAST 90 tablet 1   No current  facility-administered medications on file prior to visit.     BP 123/67 (BP Location: Right Arm, Patient Position: Sitting, Cuff Size: Large)   Pulse 86   Temp 98.1 F (36.7 C) (Oral)   Ht 5\' 8"  (1.727 m)   Wt 203 lb 9.6 oz (92.4 kg)   SpO2 100%   BMI 30.96 kg/m    Objective:   Physical Exam  Constitutional: She is oriented to person, place, and time. She appears well-developed and well-nourished.  Cardiovascular: Normal rate, regular rhythm and normal heart sounds.   No murmur heard. Pulmonary/Chest: Effort normal and breath sounds normal. No respiratory distress. She has no wheezes.  Musculoskeletal: She exhibits no edema.  Neurological: She is alert and oriented to person, place, and time.  Psychiatric: She has a normal mood and affect. Her behavior is normal. Judgment and thought content normal.          Assessment & Plan:  SOB- concerning for PE given recent plane travel.  Will check a stat D dimer. If positive will need to proceed with a stat CTA chest.  EKG tracing is personally reviewed.  EKG notes NSR.  No acute changes. Will also check baseline laboratories.

## 2016-08-16 NOTE — Patient Instructions (Signed)
Please complete lab work prior to leaving.   

## 2016-08-16 NOTE — Telephone Encounter (Signed)
Mild anemia noted.  I would like her to add a multivitamin once daily with minerals and complete ifob.

## 2016-08-16 NOTE — Telephone Encounter (Signed)
Received [facility] from [facility] in [facility] regarding negative CT Angio, no evidence of PE.  Advised them ok to notify pt and let pt leave and we will call if any further recommendations.

## 2016-08-16 NOTE — Telephone Encounter (Signed)
Noted, no further recs. 

## 2016-08-16 NOTE — Progress Notes (Signed)
Pre visit review using our clinic review tool, if applicable. No additional management support is needed unless otherwise documented below in the visit note. 

## 2016-08-16 NOTE — Telephone Encounter (Signed)
Positive d dimer, needs CTA chest today.

## 2016-08-19 DIAGNOSIS — Z8041 Family history of malignant neoplasm of ovary: Secondary | ICD-10-CM | POA: Insufficient documentation

## 2016-08-19 DIAGNOSIS — Z975 Presence of (intrauterine) contraceptive device: Secondary | ICD-10-CM | POA: Insufficient documentation

## 2016-09-15 ENCOUNTER — Encounter: Payer: Self-pay | Admitting: Family

## 2016-09-15 DIAGNOSIS — D649 Anemia, unspecified: Secondary | ICD-10-CM

## 2016-09-16 NOTE — Telephone Encounter (Signed)
[name]-- I have contacted the lab and they have no record of ever receiving pt's IFOB sample. Please see pt's additional comments above and advise?

## 2016-09-17 NOTE — Telephone Encounter (Signed)
OK to repeat CBC with Diff, dx anemia  And TSH, dx fatigue.  Let's ask her to re-submit stool samples.  Thanks,  [employer]issa

## 2016-09-18 ENCOUNTER — Other Ambulatory Visit (INDEPENDENT_AMBULATORY_CARE_PROVIDER_SITE_OTHER): Payer: 59

## 2016-09-18 DIAGNOSIS — D649 Anemia, unspecified: Secondary | ICD-10-CM

## 2016-09-19 ENCOUNTER — Encounter: Payer: Self-pay | Admitting: Family

## 2016-09-19 DIAGNOSIS — E0590 Thyrotoxicosis, unspecified without thyrotoxic crisis or storm: Secondary | ICD-10-CM

## 2016-09-19 LAB — CBC WITH DIFFERENTIAL/PLATELET
Basophils Absolute: 0.1 10*3/uL (ref 0.0–0.1)
Basophils Relative: 0.9 % (ref 0.0–3.0)
Eosinophils Absolute: 0.1 10*3/uL (ref 0.0–0.7)
Eosinophils Relative: 1.9 % (ref 0.0–5.0)
HCT: 35.1 % — ABNORMAL LOW (ref 36.0–46.0)
Hemoglobin: 11.6 g/dL — ABNORMAL LOW (ref 12.0–15.0)
Lymphocytes Relative: 29.5 % (ref 12.0–46.0)
Lymphs Abs: 1.9 10*3/uL (ref 0.7–4.0)
MCHC: 33 g/dL (ref 30.0–36.0)
MCV: 85.5 fl (ref 78.0–100.0)
Monocytes Absolute: 0.5 10*3/uL (ref 0.1–1.0)
Monocytes Relative: 8.3 % (ref 3.0–12.0)
Neutro Abs: 3.9 10*3/uL (ref 1.4–7.7)
Neutrophils Relative %: 59.4 % (ref 43.0–77.0)
Platelets: 231 10*3/uL (ref 150.0–400.0)
RBC: 4.11 Mil/uL (ref 3.87–5.11)
RDW: 12.9 % (ref 11.5–15.5)
WBC: 6.6 10*3/uL (ref 4.0–10.5)

## 2016-09-19 LAB — TSH: TSH: <0.01 u[IU]/mL — ABNORMAL LOW (ref 0.35–4.50)

## 2016-09-23 ENCOUNTER — Telehealth: Payer: Self-pay

## 2016-09-23 NOTE — Telephone Encounter (Signed)
Sent message to referral coordinator to see if she can get pt an earlier appt for her thyroid and sent message to pt that we are working on the appt and will let her know the outcome.

## 2016-09-23 NOTE — Telephone Encounter (Signed)
Patient states Dr. Charlean Sanfilippo office cant see her until 5/1 and would like to be seen sooner, please advise

## 2016-09-24 NOTE — Telephone Encounter (Signed)
Lori Stone-- please see pt's latest response regarding endocrinology referral above.

## 2016-09-26 NOTE — Telephone Encounter (Signed)
Referral had been faxed to [facility], awaiting appt, pt aware

## 2016-09-27 NOTE — Telephone Encounter (Signed)
New patient note has been forwarded to PCP and [facility] [Jen]/SLS 04/06

## 2016-10-15 NOTE — Telephone Encounter (Signed)
Opened in error

## 2016-10-22 DIAGNOSIS — D649 Anemia, unspecified: Secondary | ICD-10-CM | POA: Diagnosis not present

## 2016-10-22 DIAGNOSIS — E0590 Thyrotoxicosis, unspecified without thyrotoxic crisis or storm: Secondary | ICD-10-CM | POA: Diagnosis not present

## 2016-10-22 DIAGNOSIS — Z5181 Encounter for therapeutic drug level monitoring: Secondary | ICD-10-CM | POA: Diagnosis not present

## 2016-10-22 DIAGNOSIS — R002 Palpitations: Secondary | ICD-10-CM | POA: Diagnosis not present

## 2016-11-05 ENCOUNTER — Telehealth: Payer: Self-pay | Admitting: Family

## 2016-11-05 NOTE — Telephone Encounter (Signed)
See mychart.  

## 2016-11-08 ENCOUNTER — Other Ambulatory Visit: Payer: Self-pay | Admitting: Family

## 2016-11-08 DIAGNOSIS — Z5181 Encounter for therapeutic drug level monitoring: Secondary | ICD-10-CM | POA: Diagnosis not present

## 2016-11-11 NOTE — Telephone Encounter (Signed)
Last alprazolam RX: 05/20/16, #30 Last OV: 08/16/16 Next OV: none scheduled UDS: 12/21/15  Please advise?

## 2016-11-12 NOTE — Telephone Encounter (Signed)
Ok to print and leave at front desk, due for UDS.

## 2016-11-13 MED ORDER — ALPRAZOLAM 0.25 MG PO TABS: ORAL_TABLET | ORAL | 0 refills | Status: DC

## 2016-11-13 NOTE — Telephone Encounter (Signed)
Rx printed and forwarded to PCP for signature. 

## 2016-11-13 NOTE — Telephone Encounter (Signed)
Rx placed at front desk for pick up and and message sent to pt.

## 2016-11-15 DIAGNOSIS — E079 Disorder of thyroid, unspecified: Secondary | ICD-10-CM | POA: Diagnosis not present

## 2016-11-15 DIAGNOSIS — E0500 Thyrotoxicosis with diffuse goiter without thyrotoxic crisis or storm: Secondary | ICD-10-CM | POA: Diagnosis not present

## 2016-11-15 DIAGNOSIS — E0590 Thyrotoxicosis, unspecified without thyrotoxic crisis or storm: Secondary | ICD-10-CM | POA: Diagnosis not present

## 2016-11-15 DIAGNOSIS — L509 Urticaria, unspecified: Secondary | ICD-10-CM | POA: Diagnosis not present

## 2016-11-15 DIAGNOSIS — R21 Rash and other nonspecific skin eruption: Secondary | ICD-10-CM | POA: Diagnosis not present

## 2016-11-26 DIAGNOSIS — Z5181 Encounter for therapeutic drug level monitoring: Secondary | ICD-10-CM | POA: Diagnosis not present

## 2016-11-26 DIAGNOSIS — E0590 Thyrotoxicosis, unspecified without thyrotoxic crisis or storm: Secondary | ICD-10-CM | POA: Diagnosis not present

## 2016-12-19 DIAGNOSIS — E0500 Thyrotoxicosis with diffuse goiter without thyrotoxic crisis or storm: Secondary | ICD-10-CM | POA: Diagnosis not present

## 2016-12-23 ENCOUNTER — Other Ambulatory Visit (HOSPITAL_COMMUNITY): Payer: Self-pay | Admitting: Internal Medicine

## 2016-12-23 DIAGNOSIS — E0500 Thyrotoxicosis with diffuse goiter without thyrotoxic crisis or storm: Secondary | ICD-10-CM

## 2016-12-31 ENCOUNTER — Ambulatory Visit (HOSPITAL_COMMUNITY): Payer: 59

## 2017-01-01 ENCOUNTER — Other Ambulatory Visit (HOSPITAL_COMMUNITY): Payer: 59

## 2017-01-07 ENCOUNTER — Encounter (HOSPITAL_COMMUNITY)
Admission: RE | Admit: 2017-01-07 | Discharge: 2017-01-07 | Disposition: A | Discharge status: Home / Self Care | Payer: BLUE CROSS/BLUE SHIELD | Source: Ambulatory Visit | Attending: Internal Medicine | Admitting: Internal Medicine

## 2017-01-07 DIAGNOSIS — E0500 Thyrotoxicosis with diffuse goiter without thyrotoxic crisis or storm: Secondary | ICD-10-CM | POA: Insufficient documentation

## 2017-01-07 MED ORDER — SODIUM IODIDE I 131 CAPSULE
14.2000 | Freq: Once | INTRAVENOUS | Status: AC | PRN
  Administered 2017-01-07: 14.2 via ORAL

## 2017-01-08 ENCOUNTER — Encounter (HOSPITAL_COMMUNITY)
Admission: RE | Admit: 2017-01-08 | Discharge: 2017-01-08 | Disposition: A | Discharge status: Home / Self Care | Payer: BLUE CROSS/BLUE SHIELD | Source: Ambulatory Visit | Attending: Internal Medicine | Admitting: Internal Medicine

## 2017-01-08 DIAGNOSIS — E0500 Thyrotoxicosis with diffuse goiter without thyrotoxic crisis or storm: Secondary | ICD-10-CM | POA: Diagnosis not present

## 2017-01-08 MED ORDER — SODIUM PERTECHNETATE TC 99M INJECTION
10.0000 | Freq: Once | INTRAVENOUS | Status: AC | PRN
  Administered 2017-01-08: 10.6 via INTRAVENOUS

## 2017-01-16 DIAGNOSIS — Z5181 Encounter for therapeutic drug level monitoring: Secondary | ICD-10-CM | POA: Diagnosis not present

## 2017-01-16 DIAGNOSIS — E0590 Thyrotoxicosis, unspecified without thyrotoxic crisis or storm: Secondary | ICD-10-CM | POA: Diagnosis not present

## 2017-02-11 DIAGNOSIS — Z5181 Encounter for therapeutic drug level monitoring: Secondary | ICD-10-CM | POA: Diagnosis not present

## 2017-02-11 DIAGNOSIS — E0590 Thyrotoxicosis, unspecified without thyrotoxic crisis or storm: Secondary | ICD-10-CM | POA: Diagnosis not present

## 2017-03-28 DIAGNOSIS — E0590 Thyrotoxicosis, unspecified without thyrotoxic crisis or storm: Secondary | ICD-10-CM | POA: Diagnosis not present

## 2017-04-18 ENCOUNTER — Telehealth: Payer: Self-pay | Admitting: Family

## 2017-04-18 MED ORDER — ALPRAZOLAM 0.25 MG PO TABS: ORAL_TABLET | ORAL | 0 refills | Status: DC

## 2017-04-18 NOTE — Telephone Encounter (Signed)
Pt is requesting refill on alprazolam 0.25mg .  Last OV: 08/16/2016 Last Fill: 11/13/2016 #30 and 0RF UDS: 12/21/2015 Moderate risk  Please advise.

## 2017-04-18 NOTE — Telephone Encounter (Signed)
See rx. 

## 2017-04-21 NOTE — Telephone Encounter (Signed)
Rx was faxed on 04/18/17.

## 2017-05-30 DIAGNOSIS — E0500 Thyrotoxicosis with diffuse goiter without thyrotoxic crisis or storm: Secondary | ICD-10-CM | POA: Diagnosis not present

## 2017-06-11 ENCOUNTER — Other Ambulatory Visit (HOSPITAL_COMMUNITY): Payer: Self-pay | Admitting: Internal Medicine

## 2017-06-11 DIAGNOSIS — E0590 Thyrotoxicosis, unspecified without thyrotoxic crisis or storm: Secondary | ICD-10-CM | POA: Diagnosis not present

## 2017-06-11 DIAGNOSIS — E0500 Thyrotoxicosis with diffuse goiter without thyrotoxic crisis or storm: Secondary | ICD-10-CM

## 2017-06-11 DIAGNOSIS — R002 Palpitations: Secondary | ICD-10-CM | POA: Diagnosis not present

## 2017-07-03 ENCOUNTER — Telehealth: Payer: Self-pay | Admitting: Family

## 2017-07-03 NOTE — Telephone Encounter (Signed)
 error

## 2017-07-07 ENCOUNTER — Encounter: Payer: Self-pay | Admitting: Family

## 2017-07-07 ENCOUNTER — Ambulatory Visit: Payer: BLUE CROSS/BLUE SHIELD | Admitting: Family

## 2017-07-07 ENCOUNTER — Ambulatory Visit: Payer: Self-pay | Admitting: Family

## 2017-07-07 VITALS — BP 170/88 | HR 64 | Temp 98.4°F | Resp 18 | Ht 68.0 in | Wt 237.8 lb

## 2017-07-07 DIAGNOSIS — D649 Anemia, unspecified: Secondary | ICD-10-CM | POA: Diagnosis not present

## 2017-07-07 DIAGNOSIS — R609 Edema, unspecified: Secondary | ICD-10-CM | POA: Diagnosis not present

## 2017-07-07 DIAGNOSIS — F40243 Fear of flying: Secondary | ICD-10-CM

## 2017-07-07 DIAGNOSIS — Z79899 Other long term (current) drug therapy: Secondary | ICD-10-CM | POA: Diagnosis not present

## 2017-07-07 DIAGNOSIS — I10 Essential (primary) hypertension: Secondary | ICD-10-CM

## 2017-07-07 MED ORDER — HYDROCHLOROTHIAZIDE 25 MG PO TABS: 25.0000 mg | ORAL_TABLET | Freq: Every day | ORAL | 3 refills | Status: DC

## 2017-07-07 NOTE — Progress Notes (Signed)
Subjective:    Patient ID: Lori Stone, female    DOB: 09/13/1975, 42 y.o.   MRN: [id number]  HPI  Ms. Lori Stone is a 42 yr old female who presents today to discuss possible water retention. She reports that she has been undergoing testing for Grave's disease. She was referred to endocrinology due to low TSH. Has been working with Dr. Sharl MaKerr. Had radiation uptake over the summer.  Did not confirm Grave's disease.  She reports that her TFT's normalized.  She had allergic reaction to tapazole so this was stopped.  Has repeat uptake tomorrow. She is concerned about her weight gain.  She notes that she is having some swelling in her fingers and ankles sometimes.   Lab Results  Component Value Date   TSH <0.01 (L) 09/18/2016  last TSH 12/7 was <0.01, albumin normal at 4.1, calcium 9.2, normal LFT, Cr 0.92 Has a new job which is less stressful. Exercises 6 days a week for 30 minutes a day. Diet is "normal."    Wt Readings from Last 3 Encounters:  07/07/17 237 lb 12.8 oz (107.9 kg)  08/16/16 203 lb 9.6 oz (92.4 kg)  05/01/16 200 lb (90.7 kg)   Anemia- did not complete ifob, has IUD no menstrual loss.   Lab Results  Component Value Date   WBC 6.6 09/18/2016   HGB 11.6 (L) 09/18/2016   HCT 35.1 (L) 09/18/2016   MCV 85.5 09/18/2016   PLT 231.0 09/18/2016    Only uses xanax when she flys.  Declines flu shot today.   Review of Systems See HPI  Past Medical History:  Diagnosis Date  . Anxiety   . GERD (gastroesophageal reflux disease)      Social History   Socioeconomic History  . Marital status: Married    Spouse name: Not on file  . Number of children: 0  . Years of education: Not on file  . Highest education level: Not on file  Social Needs  . Financial resource strain: Not on file  . Food insecurity - worry: Not on file  . Food insecurity - inability: Not on file  . Transportation needs - medical: Not on file  . Transportation needs - non-medical: Not on file  Occupational  History  . Occupation: Charity fundraiserchemist  Tobacco Use  . Smoking status: Never Smoker  . Smokeless tobacco: Never Used  Substance and Sexual Activity  . Alcohol use: Yes    Alcohol/week: 1.2 oz    Types: 2 Standard drinks or equivalent per week    Comment: social  . Drug use: No  . Sexual activity: Not on file  Other Topics Concern  . 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    Past Surgical History:  Procedure Laterality Date  . DILATION AND CURETTAGE OF UTERUS  2000  . lasix Bilateral 2012  . WISDOM TOOTH EXTRACTION  1995    Family History  Problem Relation Age of Onset  . Breast cancer Maternal Grandmother        dx in her late 7370's  . Ovarian cancer Mother        dx age 43  . Celiac disease Unknown  mom's side, great aunt  . Celiac disease Mother   . Colon cancer Neg Hx     Not on File  Current Outpatient Medications on File Prior to Visit  Medication Sig Dispense Refill  . ALPRAZolam (XANAX) 0.25 MG tablet Take 1 tablet as needed before flying 30 tablet 0  . pantoprazole (PROTONIX) 40 MG tablet TAKE 1 TABLET BY MOUTH EVERY MORNING 30 MINUTES PRIOR TO BREAKFAST 90 tablet 1   No current facility-administered medications on file prior to visit.     BP (!) 164/93 (BP Location: Right Arm, Cuff Size: Large)   Pulse 64   Temp 98.4 F (36.9 C) (Oral)   Resp 18   Ht 5\' 8"  (1.727 m)   Wt 237 lb 12.8 oz (107.9 kg)   SpO2 99%   BMI 36.16 kg/m       Objective:   Physical Exam  Constitutional: She is oriented to person, place, and time. She appears well-developed and well-nourished.  HENT:  Head: Normocephalic and atraumatic.  Neck: Neck supple. Thyromegaly present.  Cardiovascular: Normal rate, regular rhythm and normal heart sounds.  No murmur  heard. Pulmonary/Chest: Effort normal and breath sounds normal. No respiratory distress. She has no wheezes.  Musculoskeletal: She exhibits no edema.  Neurological: She is alert and oriented to person, place, and time.  Psychiatric: She has a normal mood and affect. Her behavior is normal. Judgment and thought content normal.          Assessment & Plan:   Weight Gain- likely related to her thyroid.   Discussed diet/exercise.    Edema- intermittent, no significant edema today. She does have some ongoing SOB. Will check Echo, BNP, again likely related to her thyroid and BP- see below.  HTN- new diagnosis- will add hctz to help with BP and swelling. Follow up in 2 weeks for repeat bmet and BP check. Most recent K+ with her endo was 4.4.  Anemia- check cbc, b12, folate, ferritin, IFOB.    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  Obtain UDS today, controlled substance contract is signed today.

## 2017-07-07 NOTE — Patient Instructions (Addendum)
Please complete lab order before leaving today. Please add hctz 37m once daily (fluid pill/blood pressure pill). Work on low sodium diet, exercise, weight loss to help your blood pressure. Complete stool kit and return at your earliest convenience.

## 2017-07-08 ENCOUNTER — Encounter (HOSPITAL_COMMUNITY)
Admission: RE | Admit: 2017-07-08 | Discharge: 2017-07-08 | Disposition: A | Discharge status: Home / Self Care | Payer: BLUE CROSS/BLUE SHIELD | Source: Ambulatory Visit | Attending: Internal Medicine | Admitting: Internal Medicine

## 2017-07-08 ENCOUNTER — Encounter: Payer: Self-pay | Admitting: Family

## 2017-07-08 DIAGNOSIS — E0590 Thyrotoxicosis, unspecified without thyrotoxic crisis or storm: Secondary | ICD-10-CM | POA: Insufficient documentation

## 2017-07-08 DIAGNOSIS — E0500 Thyrotoxicosis with diffuse goiter without thyrotoxic crisis or storm: Secondary | ICD-10-CM | POA: Insufficient documentation

## 2017-07-08 LAB — B12 AND FOLATE PANEL
Folate: 16.7 ng/mL (ref >5.9)
Vitamin B-12: 506 pg/mL (ref 211–911)

## 2017-07-08 LAB — PAIN MGMT, PROFILE 8 W/CONF, U
6 Acetylmorphine: NEGATIVE ng/mL (ref <10)
Alcohol Metabolites: NEGATIVE ng/mL (ref <500)
Amphetamines: NEGATIVE ng/mL (ref <500)
Benzodiazepines: NEGATIVE ng/mL (ref <100)
Buprenorphine, Urine: NEGATIVE ng/mL (ref <5)
Cocaine Metabolite: NEGATIVE ng/mL (ref <150)
Creatinine: 39.7 mg/dL
MDMA: NEGATIVE ng/mL (ref <500)
Marijuana Metabolite: NEGATIVE ng/mL (ref <20)
Opiates: NEGATIVE ng/mL (ref <100)
Oxidant: NEGATIVE ug/mL (ref <200)
Oxycodone: NEGATIVE ng/mL (ref <100)
pH: 6.44 (ref 4.5–9.0)

## 2017-07-08 LAB — CBC WITH DIFFERENTIAL/PLATELET
Basophils Absolute: 0.1 10*3/uL (ref 0.0–0.1)
Basophils Relative: 1 % (ref 0.0–3.0)
Eosinophils Absolute: 0.2 10*3/uL (ref 0.0–0.7)
Eosinophils Relative: 2 % (ref 0.0–5.0)
HCT: 42.9 % (ref 36.0–46.0)
Hemoglobin: 14.1 g/dL (ref 12.0–15.0)
Lymphocytes Relative: 38.5 % (ref 12.0–46.0)
Lymphs Abs: 3.2 10*3/uL (ref 0.7–4.0)
MCHC: 33 g/dL (ref 30.0–36.0)
MCV: 90.7 fl (ref 78.0–100.0)
Monocytes Absolute: 0.5 10*3/uL (ref 0.1–1.0)
Monocytes Relative: 6.2 % (ref 3.0–12.0)
Neutro Abs: 4.3 10*3/uL (ref 1.4–7.7)
Neutrophils Relative %: 52.3 % (ref 43.0–77.0)
Platelets: 275 10*3/uL (ref 150.0–400.0)
RBC: 4.73 Mil/uL (ref 3.87–5.11)
RDW: 13.6 % (ref 11.5–15.5)
WBC: 8.2 10*3/uL (ref 4.0–10.5)

## 2017-07-08 LAB — FERRITIN: Ferritin: 54.6 ng/mL (ref 10.0–291.0)

## 2017-07-08 LAB — BRAIN NATRIURETIC PEPTIDE: Pro B Natriuretic peptide (BNP): 23 pg/mL (ref 0.0–100.0)

## 2017-07-08 MED ORDER — SODIUM IODIDE I 131 CAPSULE
13.8000 | Freq: Once | INTRAVENOUS | Status: AC | PRN
  Administered 2017-07-08: 13.8 via ORAL

## 2017-07-09 ENCOUNTER — Encounter (HOSPITAL_COMMUNITY)
Admission: RE | Admit: 2017-07-09 | Discharge: 2017-07-09 | Disposition: A | Discharge status: Home / Self Care | Payer: BLUE CROSS/BLUE SHIELD | Source: Ambulatory Visit | Attending: Internal Medicine | Admitting: Internal Medicine

## 2017-07-09 DIAGNOSIS — E0500 Thyrotoxicosis with diffuse goiter without thyrotoxic crisis or storm: Secondary | ICD-10-CM | POA: Diagnosis not present

## 2017-07-22 ENCOUNTER — Other Ambulatory Visit (HOSPITAL_COMMUNITY): Payer: Self-pay | Admitting: Internal Medicine

## 2017-07-22 ENCOUNTER — Ambulatory Visit: Payer: BLUE CROSS/BLUE SHIELD | Admitting: Family

## 2017-07-22 DIAGNOSIS — E0590 Thyrotoxicosis, unspecified without thyrotoxic crisis or storm: Secondary | ICD-10-CM

## 2017-07-25 ENCOUNTER — Ambulatory Visit (HOSPITAL_COMMUNITY): Payer: BLUE CROSS/BLUE SHIELD

## 2017-07-25 ENCOUNTER — Ambulatory Visit: Payer: Self-pay | Admitting: Family

## 2017-07-28 ENCOUNTER — Ambulatory Visit: Payer: BLUE CROSS/BLUE SHIELD | Admitting: Family

## 2017-07-28 ENCOUNTER — Encounter: Payer: Self-pay | Admitting: Family

## 2017-07-28 ENCOUNTER — Encounter (HOSPITAL_BASED_OUTPATIENT_CLINIC_OR_DEPARTMENT_OTHER): Payer: Self-pay

## 2017-07-28 ENCOUNTER — Other Ambulatory Visit (HOSPITAL_BASED_OUTPATIENT_CLINIC_OR_DEPARTMENT_OTHER): Payer: BLUE CROSS/BLUE SHIELD

## 2017-07-28 VITALS — BP 150/98 | HR 74 | Temp 98.1°F | Resp 18 | Ht 68.0 in | Wt 241.2 lb

## 2017-07-28 DIAGNOSIS — I10 Essential (primary) hypertension: Secondary | ICD-10-CM | POA: Diagnosis not present

## 2017-07-28 LAB — BASIC METABOLIC PANEL
BUN: 14 mg/dL (ref 6–23)
CO2: 24 mEq/L (ref 19–32)
Calcium: 8.6 mg/dL (ref 8.4–10.5)
Chloride: 108 mEq/L (ref 96–112)
Creatinine, Ser: 0.82 mg/dL (ref 0.40–1.20)
GFR: 80.84 mL/min (ref >60.00)
Glucose, Bld: 78 mg/dL (ref 70–99)
Potassium: 3.7 mEq/L (ref 3.5–5.1)
Sodium: 140 mEq/L (ref 135–145)

## 2017-07-28 MED ORDER — AMLODIPINE BESYLATE 5 MG PO TABS: 5.0000 mg | ORAL_TABLET | Freq: Every day | ORAL | 3 refills | Status: DC

## 2017-07-28 NOTE — Patient Instructions (Signed)
Please complete lab work prior to leaving. Add amlodipine 5mg once daily.  

## 2017-07-28 NOTE — Progress Notes (Signed)
Subjective:    Patient ID: Lori Stone, female    DOB: 05/27/1976, 42 y.o.   MRN: [id number]  HPI   Pt is a 42 yr old female who presents today for follow up of her hypertension. Last visit we added hctz to help with BP and swelling last visit.  She reports tolerating without difficulty.   BP Readings from Last 3 Encounters:  07/28/17 (!) 150/98  07/07/17 (!) 170/88  08/16/16 123/67     Review of Systems See HPI  Past Medical History:  Diagnosis Date  . Anxiety   . GERD (gastroesophageal reflux disease)      Social History   Socioeconomic History  . Marital status: Married    Spouse name: Not on file  . Number of children: 0  . Years of education: Not on file  . Highest education level: Not on file  Social Needs  . Financial resource strain: Not on file  . Food insecurity - worry: Not on file  . Food insecurity - inability: Not on file  . Transportation needs - medical: Not on file  . Transportation needs - non-medical: Not on file  Occupational History  . Occupation: Charity fundraiserchemist  Tobacco Use  . Smoking status: Never Smoker  . Smokeless tobacco: Never Used  Substance and Sexual Activity  . Alcohol use: Yes    Alcohol/week: 1.2 oz    Types: 2 Standard drinks or equivalent per week    Comment: social  . Drug use: No  . Sexual activity: Not on file  Other Topics Concern  . 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    Past Surgical History:  Procedure Laterality Date  . DILATION AND CURETTAGE OF UTERUS  2000  . lasix Bilateral 2012  . WISDOM TOOTH EXTRACTION  1995    Family History  Problem Relation Age of Onset  . Breast cancer Maternal Grandmother        dx in her late 1270's  . Ovarian cancer Mother        dx age 43  . Celiac disease  Unknown        mom's side, great aunt  . Celiac disease Mother   . Colon cancer Neg Hx     Allergies  Allergen Reactions  . Methimazole Itching and Rash  . Propylthiouracil Hives and Itching    Current Outpatient Medications on File Prior to Visit  Medication Sig Dispense Refill  . ALPRAZolam (XANAX) 0.25 MG tablet Take 1 tablet as needed before flying 30 tablet 0  . hydrochlorothiazide (HYDRODIURIL) 25 MG tablet Take 1 tablet (25 mg total) by mouth daily. 30 tablet 3  . pantoprazole (PROTONIX) 40 MG tablet TAKE 1 TABLET BY MOUTH EVERY MORNING 30 MINUTES PRIOR TO BREAKFAST 90 tablet 1   No current facility-administered medications on file prior to visit.     BP (!) 150/98   Pulse 74   Temp 98.1 F (36.7 C) (Oral)   Resp 18   Ht 5\' 8"  (1.727 m)   Wt 241 lb 3.2 oz (109.4 kg)   SpO2 100%   BMI 36.67 kg/m       Objective:   Physical Exam  Constitutional: She is  oriented to person, place, and time. She appears well-developed and well-nourished.  HENT:  Head: Normocephalic and atraumatic.  Cardiovascular: Normal rate, regular rhythm and normal heart sounds.  No murmur heard. Pulmonary/Chest: Effort normal and breath sounds normal. No respiratory distress. She has no wheezes.  Abdominal: Soft. Bowel sounds are normal.  Musculoskeletal: She exhibits no edema.  Neurological: She is alert and oriented to person, place, and time.  Psychiatric: She has a normal mood and affect. Her behavior is normal. Judgment and thought content normal.          Assessment & Plan:  HTN- improving but still above goal. Check bmet, continue hctz, add amlodipine 5mg , follow up in 1 month.

## 2017-07-31 ENCOUNTER — Other Ambulatory Visit (INDEPENDENT_AMBULATORY_CARE_PROVIDER_SITE_OTHER): Payer: BLUE CROSS/BLUE SHIELD

## 2017-07-31 DIAGNOSIS — D649 Anemia, unspecified: Secondary | ICD-10-CM | POA: Diagnosis not present

## 2017-07-31 LAB — FECAL OCCULT BLOOD, IMMUNOCHEMICAL: Fecal Occult Bld: NEGATIVE

## 2017-08-01 DIAGNOSIS — D2262 Melanocytic nevi of left upper limb, including shoulder: Secondary | ICD-10-CM | POA: Diagnosis not present

## 2017-08-01 DIAGNOSIS — D225 Melanocytic nevi of trunk: Secondary | ICD-10-CM | POA: Diagnosis not present

## 2017-08-01 DIAGNOSIS — D485 Neoplasm of uncertain behavior of skin: Secondary | ICD-10-CM | POA: Diagnosis not present

## 2017-08-05 ENCOUNTER — Encounter: Payer: Self-pay | Admitting: Family

## 2017-08-22 ENCOUNTER — Encounter (HOSPITAL_COMMUNITY)
Admission: RE | Admit: 2017-08-22 | Discharge: 2017-08-22 | Disposition: A | Discharge status: Home / Self Care | Payer: BLUE CROSS/BLUE SHIELD | Source: Ambulatory Visit | Attending: Internal Medicine | Admitting: Internal Medicine

## 2017-08-22 DIAGNOSIS — E0590 Thyrotoxicosis, unspecified without thyrotoxic crisis or storm: Secondary | ICD-10-CM | POA: Diagnosis not present

## 2017-08-22 LAB — HCG, QUANTITATIVE, PREGNANCY: hCG, Beta Chain, Quant, S: <1 m[IU]/mL (ref <5)

## 2017-08-22 MED ORDER — SODIUM IODIDE I 131 CAPSULE
19.3000 | Freq: Once | INTRAVENOUS | Status: AC | PRN
  Administered 2017-08-22: 19.3 via ORAL

## 2017-08-29 ENCOUNTER — Ambulatory Visit: Payer: BLUE CROSS/BLUE SHIELD | Admitting: Family

## 2017-08-29 ENCOUNTER — Ambulatory Visit (HOSPITAL_BASED_OUTPATIENT_CLINIC_OR_DEPARTMENT_OTHER)
Admission: RE | Admit: 2017-08-29 | Discharge: 2017-08-29 | Disposition: A | Discharge status: Home / Self Care | Payer: BLUE CROSS/BLUE SHIELD | Source: Ambulatory Visit | Attending: Family | Admitting: Family

## 2017-08-29 ENCOUNTER — Encounter: Payer: Self-pay | Admitting: Family

## 2017-08-29 VITALS — BP 135/92 | HR 65 | Temp 97.9°F | Resp 16 | Ht 68.0 in | Wt 235.4 lb

## 2017-08-29 DIAGNOSIS — R609 Edema, unspecified: Secondary | ICD-10-CM

## 2017-08-29 DIAGNOSIS — I420 Dilated cardiomyopathy: Secondary | ICD-10-CM | POA: Insufficient documentation

## 2017-08-29 DIAGNOSIS — Z0000 Encounter for general adult medical examination without abnormal findings: Secondary | ICD-10-CM | POA: Diagnosis not present

## 2017-08-29 DIAGNOSIS — I051 Rheumatic mitral insufficiency: Secondary | ICD-10-CM | POA: Diagnosis not present

## 2017-08-29 LAB — ECHOCARDIOGRAM COMPLETE
Height: 68 in
Weight: 3766.4 oz

## 2017-08-29 NOTE — Progress Notes (Signed)
Subjective:    Patient ID: Lori Stone, female    DOB: 09-30-1975, 42 y.o.   MRN: [id number]  Lori Stone  Lori Stone is a 42 yr old female who presents today for follow up.  HTN- Last visit we added amlodipine 5mg  to her hctz.   BP Readings from Last 3 Encounters:  08/29/17 (!) 135/92  07/28/17 (!) 150/98  07/07/17 (!) 170/88      Review of Systems See Lori Stone  Past Medical History:  Diagnosis Date  . Anxiety   . GERD (gastroesophageal reflux disease)      Social History   Socioeconomic History  . Marital status: Married    Spouse name: Not on file  . Number of children: 0  . Years of education: Not on file  . Highest education level: Not on file  Social Needs  . Financial resource strain: Not on file  . Food insecurity - worry: Not on file  . Food insecurity - inability: Not on file  . Transportation needs - medical: Not on file  . Transportation needs - non-medical: Not on file  Occupational History  . Occupation: Charity fundraiser  Tobacco Use  . Smoking status: Never Smoker  . Smokeless tobacco: Never Used  Substance and Sexual Activity  . Alcohol use: Yes    Alcohol/week: 1.2 oz    Types: 2 Standard drinks or equivalent per week    Comment: social  . Drug use: No  . Sexual activity: Not on file  Other Topics Concern  . 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    Past Surgical History:  Procedure Laterality Date  . DILATION AND CURETTAGE OF UTERUS  2000  . lasix Bilateral 2012  . WISDOM TOOTH EXTRACTION  1995    Family History  Problem Relation Age of Onset  . Breast cancer Maternal Grandmother        dx in her late 72's  . Ovarian cancer Mother        dx age 2  . Celiac disease Unknown        mom's side, great aunt  . Celiac disease Mother     . Colon cancer Neg Hx     Allergies  Allergen Reactions  . Methimazole Itching and Rash  . Propylthiouracil Hives and Itching    Current Outpatient Medications on File Prior to Visit  Medication Sig Dispense Refill  . ALPRAZolam (XANAX) 0.25 MG tablet Take 1 tablet as needed before flying 30 tablet 0  . amLODipine (NORVASC) 5 MG tablet Take 1 tablet (5 mg total) by mouth daily. 30 tablet 3  . hydrochlorothiazide (HYDRODIURIL) 25 MG tablet Take 1 tablet (25 mg total) by mouth daily. 30 tablet 3  . pantoprazole (PROTONIX) 40 MG tablet TAKE 1 TABLET BY MOUTH EVERY MORNING 30 MINUTES PRIOR TO BREAKFAST 90 tablet 1   No current facility-administered medications on file prior to visit.     Exam  Gen: awake, alert, NAD CV: S1/S2, rrr, no edema Resp: breath sounds clear to auscultation bilaterally Psych: calm, pleasant          Assessment & Plan:   HTN- Bp is improved. Continue current meds, advised pt as follows:  Please continue current meds. Check blood pressure 2 or 3 times a week.  Send me your readings over the next few weeks.

## 2017-08-29 NOTE — Patient Instructions (Addendum)
Please continue current meds. Check blood pressure 2 or 3 times a week.  Send me your readings overPlease continue current meds. Check blood pressure 2 or 3 times a week.  Send me your readings over the next few weeks.

## 2017-08-29 NOTE — Progress Notes (Signed)
Echocardiogram 2D Echocardiogram has been performed.  Lori Stone, Lori Stone M 08/29/2017, 9:02 AM

## 2017-09-03 ENCOUNTER — Other Ambulatory Visit: Payer: Self-pay | Admitting: Gastroenterology

## 2017-09-05 ENCOUNTER — Other Ambulatory Visit: Payer: Self-pay | Admitting: Gastroenterology

## 2017-09-05 ENCOUNTER — Other Ambulatory Visit: Payer: Self-pay | Admitting: Family

## 2017-09-08 NOTE — Telephone Encounter (Signed)
Last alprazolam RX: 04/18/17, #30 Last OV: 08/29/17  Next OV: 11/28/17 UDS: 07/07/17 CSC: 07/07/17 CSR: No discrepancies identified

## 2017-09-14 ENCOUNTER — Encounter: Payer: Self-pay | Admitting: Family

## 2017-09-19 DIAGNOSIS — E0590 Thyrotoxicosis, unspecified without thyrotoxic crisis or storm: Secondary | ICD-10-CM | POA: Diagnosis not present

## 2017-09-23 ENCOUNTER — Encounter: Payer: Self-pay | Admitting: Family

## 2017-09-24 MED ORDER — PANTOPRAZOLE SODIUM 40 MG PO TBEC: DELAYED_RELEASE_TABLET | ORAL | 1 refills | Status: DC

## 2017-10-01 ENCOUNTER — Other Ambulatory Visit: Payer: Self-pay | Admitting: Family

## 2017-10-13 DIAGNOSIS — E0590 Thyrotoxicosis, unspecified without thyrotoxic crisis or storm: Secondary | ICD-10-CM | POA: Diagnosis not present

## 2017-10-21 DIAGNOSIS — E890 Postprocedural hypothyroidism: Secondary | ICD-10-CM | POA: Diagnosis not present

## 2017-10-21 DIAGNOSIS — E0500 Thyrotoxicosis with diffuse goiter without thyrotoxic crisis or storm: Secondary | ICD-10-CM | POA: Diagnosis not present

## 2017-10-22 HISTORY — PX: OTHER SURGICAL HISTORY: SHX169

## 2017-11-13 ENCOUNTER — Encounter: Payer: Self-pay | Admitting: Family

## 2017-11-14 MED ORDER — HYDROCHLOROTHIAZIDE 25 MG PO TABS: 25.0000 mg | ORAL_TABLET | Freq: Every day | ORAL | 1 refills | Status: DC

## 2017-11-14 NOTE — Telephone Encounter (Signed)
Spoke with pharmacist. He states the 30 day Rx was cancelled for a 90 day rx but they do see the received the 90 day Rx that was sent. He took a [facility] for #90 x 1 refill. Message sent to pt.

## 2017-11-27 DIAGNOSIS — E890 Postprocedural hypothyroidism: Secondary | ICD-10-CM | POA: Diagnosis not present

## 2017-11-28 ENCOUNTER — Ambulatory Visit: Payer: BLUE CROSS/BLUE SHIELD | Admitting: Family

## 2018-02-16 ENCOUNTER — Encounter: Payer: Self-pay | Admitting: Family

## 2018-02-16 ENCOUNTER — Ambulatory Visit: Payer: BLUE CROSS/BLUE SHIELD | Admitting: Family

## 2018-02-16 VITALS — BP 138/94 | HR 69 | Temp 98.4°F | Resp 18 | Ht 68.0 in | Wt 247.2 lb

## 2018-02-16 DIAGNOSIS — I10 Essential (primary) hypertension: Secondary | ICD-10-CM

## 2018-02-16 DIAGNOSIS — F40243 Fear of flying: Secondary | ICD-10-CM | POA: Diagnosis not present

## 2018-02-16 DIAGNOSIS — J029 Acute pharyngitis, unspecified: Secondary | ICD-10-CM

## 2018-02-16 DIAGNOSIS — Z79899 Other long term (current) drug therapy: Secondary | ICD-10-CM | POA: Diagnosis not present

## 2018-02-16 DIAGNOSIS — E039 Hypothyroidism, unspecified: Secondary | ICD-10-CM

## 2018-02-16 LAB — BASIC METABOLIC PANEL
BUN: 9 mg/dL (ref 6–23)
CO2: 28 mEq/L (ref 19–32)
Calcium: 9.4 mg/dL (ref 8.4–10.5)
Chloride: 105 mEq/L (ref 96–112)
Creatinine, Ser: 0.89 mg/dL (ref 0.40–1.20)
GFR: 73.35 mL/min (ref >60.00)
Glucose, Bld: 83 mg/dL (ref 70–99)
Potassium: 4.3 mEq/L (ref 3.5–5.1)
Sodium: 140 mEq/L (ref 135–145)

## 2018-02-16 LAB — POCT RAPID STREP A (OFFICE): Rapid Strep A Screen: NEGATIVE

## 2018-02-16 NOTE — Patient Instructions (Signed)
Please continue to work on healthy low sodium diet, exercise and weight loss. Complete lab work prior to leaving. Call if sore throat symptoms worsen or if not improved in 3 days.

## 2018-02-16 NOTE — Progress Notes (Signed)
Subjective:    Patient ID: Lori Stone, female    DOB: 11/29/1975, 42 y.o.   MRN: [id number]  HPI  Patient is a 42 yr old female who presents today with chief complaint of sore throat. Started Saturday.  Had sick contacts.    She is also due for routine follow up:  HTN- she is maintained on hctz. Ran out of amlodipine.  Reports home BP readings 130's/90's.   BP Readings from Last 3 Encounters:  02/16/18 (!) 138/94  08/29/17 (!) 135/92  07/28/17 (!) 150/98   Fear of flying- uses xanax prn.   Hypothyroid- She reports that she had radioactive iodine ablation on 10/25/17. Followed by Dr. Sharl Ma, now on synthroid. Has follow up with Dr. Sharl Ma next week.  Lab Results  Component Value Date   TSH <0.01 (L) 09/18/2016     Review of Systems See HPI  Past Medical History:  Diagnosis Date  . Anxiety   . GERD (gastroesophageal reflux disease)   . Hyperthyroidism      Social History   Socioeconomic History  . Marital status: Married    Spouse name: Not on file  . Number of children: 0  . Years of education: Not on file  . Highest education level: Not on file  Occupational History  . Occupation: Charity fundraiser  Social Needs  . Financial resource strain: Not on file  . Food insecurity:    Worry: Not on file    Inability: Not on file  . Transportation needs:    Medical: Not on file    Non-medical: Not on file  Tobacco Use  . Smoking status: Never Smoker  . Smokeless tobacco: Never Used  Substance and Sexual Activity  . Alcohol use: Yes    Alcohol/week: 2.0 standard drinks    Types: 2 Standard drinks or equivalent per week    Comment: social  . Drug use: No  . Sexual activity: Not on file  Lifestyle  . Physical activity:    Days per week: Not on file    Minutes per session: Not on file  . Stress: Not on file  Relationships  . Social connections:    Talks on phone: Not on file    Gets together: Not on file    Attends religious service: Not on file    Active member of club  or organization: Not on file    Attends meetings of clubs or organizations: Not on file    Relationship status: Not on file  . Intimate partner violence:    Fear of current or ex partner: Not on file    Emotionally abused: Not on file    Physically abused: Not on file    Forced sexual activity: Not on file  Other Topics Concern  . 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    Past Surgical History:  Procedure Laterality Date  . DILATION AND CURETTAGE OF UTERUS  2000  . lasix Bilateral 2012  . radioactive iodine ablation  10/2017   Dr, Sharl Ma  . WISDOM TOOTH EXTRACTION  1995    Family History  Problem Relation Age of Onset  . Breast cancer Maternal Grandmother  dx in her late 6470's  . Ovarian cancer Mother        dx age 43  . Celiac disease Unknown        mom's side, great aunt  . Celiac disease Mother   . Colon cancer Neg Hx     Allergies  Allergen Reactions  . Methimazole Itching and Rash  . Propylthiouracil Hives and Itching    Current Outpatient Medications on File Prior to Visit  Medication Sig Dispense Refill  . ALPRAZolam (XANAX) 0.25 MG tablet TAKE 1 TABLET BY MOUTH AS NEEDED BEFORE FLYING 30 tablet 0  . hydrochlorothiazide (HYDRODIURIL) 25 MG tablet Take 1 tablet (25 mg total) by mouth daily. 90 tablet 1  . levothyroxine (SYNTHROID, LEVOTHROID) 175 MCG tablet Take 1 tablet by mouth every morning.  4  . pantoprazole (PROTONIX) 40 MG tablet TAKE 1 TABLET BY MOUTH EVERY MORNING 30 MINUTES PRIOR TO BREAKFAST 90 tablet 1   No current facility-administered medications on file prior to visit.     BP (!) 138/94 (BP Location: Left Arm, Cuff Size: Large)   Pulse 69   Temp 98.4 F (36.9 C) (Oral)   Resp 18   Ht 5\' 8"  (1.727 m)   Wt 247 lb 3.2 oz  (112.1 kg)   SpO2 100%   BMI 37.59 kg/m       Objective:   Physical Exam  Constitutional: She appears well-developed and well-nourished.  HENT:  Right Ear: Tympanic membrane normal.  Left Ear: Tympanic membrane normal.  Mouth/Throat: No oropharyngeal exudate, posterior oropharyngeal edema or posterior oropharyngeal erythema.  Cardiovascular: Normal rate, regular rhythm and normal heart sounds.  No murmur heard. Pulmonary/Chest: Effort normal and breath sounds normal. No respiratory distress. She has no wheezes.  Lymphadenopathy:    She has cervical adenopathy.  Psychiatric: She has a normal mood and affect. Her behavior is normal. Judgment and thought content normal.          Assessment & Plan:  Viral pharyngitis- rapid strep negative.   HTN- BP elevated here. She reports much better home readings. Discussed diet. Plan to have her continue to monitor home bp readings and bring with her to her next visit. If still elevated could consider addition of a beta blocker.   Fear of flying-  Continue prn xanax.  UDS today.   Hypothyroid- s/p ablation, management per endo.

## 2018-02-17 LAB — PAIN MGMT, PROFILE 8 W/CONF, U
6 Acetylmorphine: NEGATIVE ng/mL (ref <10)
Alcohol Metabolites: NEGATIVE ng/mL (ref <500)
Amphetamines: NEGATIVE ng/mL (ref <500)
Benzodiazepines: NEGATIVE ng/mL (ref <100)
Buprenorphine, Urine: NEGATIVE ng/mL (ref <5)
Cocaine Metabolite: NEGATIVE ng/mL (ref <150)
Creatinine: 39 mg/dL
MDMA: NEGATIVE ng/mL (ref <500)
Marijuana Metabolite: NEGATIVE ng/mL (ref <20)
Opiates: NEGATIVE ng/mL (ref <100)
Oxidant: NEGATIVE ug/mL (ref <200)
Oxycodone: NEGATIVE ng/mL (ref <100)
pH: 6.33 (ref 4.5–9.0)

## 2018-02-24 DIAGNOSIS — E890 Postprocedural hypothyroidism: Secondary | ICD-10-CM | POA: Diagnosis not present

## 2018-02-27 DIAGNOSIS — E890 Postprocedural hypothyroidism: Secondary | ICD-10-CM | POA: Diagnosis not present

## 2018-02-27 DIAGNOSIS — H938X9 Other specified disorders of ear, unspecified ear: Secondary | ICD-10-CM | POA: Diagnosis not present

## 2018-02-27 DIAGNOSIS — E0500 Thyrotoxicosis with diffuse goiter without thyrotoxic crisis or storm: Secondary | ICD-10-CM | POA: Diagnosis not present

## 2018-03-12 ENCOUNTER — Encounter: Payer: Self-pay | Admitting: Family

## 2018-03-12 DIAGNOSIS — H6982 Other specified disorders of Eustachian tube, left ear: Secondary | ICD-10-CM | POA: Diagnosis not present

## 2018-03-12 DIAGNOSIS — H9042 Sensorineural hearing loss, unilateral, left ear, with unrestricted hearing on the contralateral side: Secondary | ICD-10-CM | POA: Diagnosis not present

## 2018-03-12 DIAGNOSIS — H938X9 Other specified disorders of ear, unspecified ear: Secondary | ICD-10-CM | POA: Diagnosis not present

## 2018-03-12 DIAGNOSIS — K219 Gastro-esophageal reflux disease without esophagitis: Secondary | ICD-10-CM | POA: Insufficient documentation

## 2018-03-12 DIAGNOSIS — H833X2 Noise effects on left inner ear: Secondary | ICD-10-CM | POA: Diagnosis not present

## 2018-03-13 MED ORDER — ALPRAZOLAM 0.25 MG PO TABS: ORAL_TABLET | ORAL | 0 refills | Status: DC

## 2018-03-13 NOTE — Telephone Encounter (Signed)
Last xanax RX: 09/08/17, #30 Last OV: 02/16/18 Next OV: UDS: 02/16/18 CSC: 07/07/17 CSR:  No discrepancies identified

## 2018-03-13 NOTE — Telephone Encounter (Signed)
Rx called to pharmacy voicemail per PCP and message sent to pt.

## 2018-04-30 DIAGNOSIS — K219 Gastro-esophageal reflux disease without esophagitis: Secondary | ICD-10-CM | POA: Diagnosis not present

## 2018-07-10 ENCOUNTER — Encounter: Payer: Self-pay | Admitting: Family

## 2018-07-13 MED ORDER — ALPRAZOLAM 0.25 MG PO TABS: ORAL_TABLET | ORAL | 0 refills | Status: DC

## 2018-07-29 DIAGNOSIS — E0590 Thyrotoxicosis, unspecified without thyrotoxic crisis or storm: Secondary | ICD-10-CM | POA: Diagnosis not present

## 2018-09-25 DIAGNOSIS — E0590 Thyrotoxicosis, unspecified without thyrotoxic crisis or storm: Secondary | ICD-10-CM | POA: Diagnosis not present

## 2018-11-28 ENCOUNTER — Encounter: Payer: Self-pay | Admitting: Family

## 2018-11-30 ENCOUNTER — Other Ambulatory Visit: Payer: Self-pay

## 2018-11-30 MED ORDER — PANTOPRAZOLE SODIUM 40 MG PO TBEC: DELAYED_RELEASE_TABLET | ORAL | 0 refills | Status: DC

## 2018-11-30 NOTE — Telephone Encounter (Signed)
30 days supply sent to patient's pharmacy, lvm for patient to call and schedule in office annual physical

## 2018-12-27 ENCOUNTER — Other Ambulatory Visit: Payer: Self-pay | Admitting: Family

## 2018-12-30 ENCOUNTER — Ambulatory Visit (INDEPENDENT_AMBULATORY_CARE_PROVIDER_SITE_OTHER): Payer: BC Managed Care – PPO | Admitting: Family

## 2018-12-31 DIAGNOSIS — Z30431 Encounter for routine checking of intrauterine contraceptive device: Secondary | ICD-10-CM | POA: Diagnosis not present

## 2018-12-31 DIAGNOSIS — I10 Essential (primary) hypertension: Secondary | ICD-10-CM | POA: Diagnosis not present

## 2018-12-31 DIAGNOSIS — E0590 Thyrotoxicosis, unspecified without thyrotoxic crisis or storm: Secondary | ICD-10-CM | POA: Diagnosis not present

## 2018-12-31 DIAGNOSIS — Z01419 Encounter for gynecological examination (general) (routine) without abnormal findings: Secondary | ICD-10-CM | POA: Diagnosis not present

## 2018-12-31 DIAGNOSIS — Z1151 Encounter for screening for human papillomavirus (HPV): Secondary | ICD-10-CM | POA: Diagnosis not present

## 2018-12-31 DIAGNOSIS — E669 Obesity, unspecified: Secondary | ICD-10-CM | POA: Insufficient documentation

## 2019-01-05 DIAGNOSIS — E890 Postprocedural hypothyroidism: Secondary | ICD-10-CM | POA: Diagnosis not present

## 2019-01-05 DIAGNOSIS — E0500 Thyrotoxicosis with diffuse goiter without thyrotoxic crisis or storm: Secondary | ICD-10-CM | POA: Diagnosis not present

## 2019-01-09 LAB — HM PAP SMEAR: HM Pap smear: NEGATIVE

## 2019-01-19 ENCOUNTER — Ambulatory Visit (INDEPENDENT_AMBULATORY_CARE_PROVIDER_SITE_OTHER): Payer: BC Managed Care – PPO | Admitting: Family

## 2019-01-19 ENCOUNTER — Other Ambulatory Visit: Payer: Self-pay

## 2019-01-19 ENCOUNTER — Encounter: Payer: Self-pay | Admitting: Family

## 2019-01-19 VITALS — BP 132/86 | HR 67 | Temp 98.7°F | Resp 16 | Ht 67.0 in | Wt 240.0 lb

## 2019-01-19 DIAGNOSIS — Z0000 Encounter for general adult medical examination without abnormal findings: Secondary | ICD-10-CM | POA: Diagnosis not present

## 2019-01-19 LAB — HEPATIC FUNCTION PANEL
ALT: 16 U/L (ref 0–35)
AST: 16 U/L (ref 0–37)
Albumin: 4.2 g/dL (ref 3.5–5.2)
Alkaline Phosphatase: 51 U/L (ref 39–117)
Bilirubin, Direct: 0 mg/dL (ref 0.0–0.3)
Total Bilirubin: 0.3 mg/dL (ref 0.2–1.2)
Total Protein: 6.8 g/dL (ref 6.0–8.3)

## 2019-01-19 LAB — BASIC METABOLIC PANEL
BUN: 10 mg/dL (ref 6–23)
CO2: 30 mEq/L (ref 19–32)
Calcium: 9.7 mg/dL (ref 8.4–10.5)
Chloride: 105 mEq/L (ref 96–112)
Creatinine, Ser: 0.88 mg/dL (ref 0.40–1.20)
GFR: 69.62 mL/min (ref >60.00)
Glucose, Bld: 75 mg/dL (ref 70–99)
Potassium: 4.4 mEq/L (ref 3.5–5.1)
Sodium: 141 mEq/L (ref 135–145)

## 2019-01-19 LAB — CBC WITH DIFFERENTIAL/PLATELET
Basophils Absolute: 0 10*3/uL (ref 0.0–0.1)
Basophils Relative: 0.8 % (ref 0.0–3.0)
Eosinophils Absolute: 0.1 10*3/uL (ref 0.0–0.7)
Eosinophils Relative: 2.1 % (ref 0.0–5.0)
HCT: 42.3 % (ref 36.0–46.0)
Hemoglobin: 13.9 g/dL (ref 12.0–15.0)
Lymphocytes Relative: 37.2 % (ref 12.0–46.0)
Lymphs Abs: 2.4 10*3/uL (ref 0.7–4.0)
MCHC: 32.9 g/dL (ref 30.0–36.0)
MCV: 89 fl (ref 78.0–100.0)
Monocytes Absolute: 0.4 10*3/uL (ref 0.1–1.0)
Monocytes Relative: 6 % (ref 3.0–12.0)
Neutro Abs: 3.5 10*3/uL (ref 1.4–7.7)
Neutrophils Relative %: 53.9 % (ref 43.0–77.0)
Platelets: 251 10*3/uL (ref 150.0–400.0)
RBC: 4.76 Mil/uL (ref 3.87–5.11)
RDW: 14.2 % (ref 11.5–15.5)
WBC: 6.5 10*3/uL (ref 4.0–10.5)

## 2019-01-19 LAB — LIPID PANEL
Cholesterol: 215 mg/dL — ABNORMAL HIGH (ref 0–200)
HDL: 60 mg/dL (ref >39.00)
LDL Cholesterol: 122 mg/dL — ABNORMAL HIGH (ref 0–99)
NonHDL: 154.51
Total CHOL/HDL Ratio: 4
Triglycerides: 162 mg/dL — ABNORMAL HIGH (ref 0.0–149.0)
VLDL: 32.4 mg/dL (ref 0.0–40.0)

## 2019-01-19 NOTE — Progress Notes (Signed)
Subjective:    Patient ID: Lori Stone, female    DOB: 02/23/1976, 43 y.o.   MRN: [id number]  HPI  Patient presents today for complete physical.  Immunizations: declines tetanus Diet:   Reports that her weight has been fluctuating due to thyroid. She sees Dr. Sharl MaKerr for endocrinology Wt Readings from Last 3 Encounters:  01/19/19 240 lb (108.9 kg)  02/16/18 247 lb 3.2 oz (112.1 kg)  08/29/17 235 lb 6.4 oz (106.8 kg)   Reports that she tends to retain fluid, ok with hctz Exercise: enjoys walking (treadmill and elliptical- 30 min a day) Pap Smear: up to date Mammogram: up to date Dental: up to date Vision: up to date  GERD- generally stable on protonix.      Review of Systems  Constitutional: Positive for unexpected weight change.  HENT: Negative for hearing loss and rhinorrhea.   Eyes: Negative for visual disturbance.  Respiratory: Positive for shortness of breath (attributes to hyperthyroid/weight gain). Negative for cough.   Cardiovascular: Negative for chest pain.  Gastrointestinal: Negative for constipation and diarrhea.  Genitourinary: Negative for dysuria, frequency, hematuria and menstrual problem (has iud).  Musculoskeletal: Negative for arthralgias and myalgias.  Skin: Negative for rash.  Neurological: Positive for headaches (occasional HA's).  Hematological: Negative for adenopathy.  Psychiatric/Behavioral:       Denies depression/anxiety      Past Medical History:  Diagnosis Date  . Anxiety   . GERD (gastroesophageal reflux disease)   . Hyperthyroidism      Social History   Socioeconomic History  . Marital status: Married    Spouse name: Not on file  . Number of children: 0  . Years of education: Not on file  . Highest education level: Not on file  Occupational History  . Occupation: Charity fundraiserchemist  Social Needs  . Financial resource strain: Not on file  . Food insecurity    Worry: Not on file    Inability: Not on file  . Transportation needs   Medical: Not on file    Non-medical: Not on file  Tobacco Use  . Smoking status: Never Smoker  . Smokeless tobacco: Never Used  Substance and Sexual Activity  . Alcohol use: Yes    Alcohol/week: 2.0 standard drinks    Types: 2 Standard drinks or equivalent per week    Comment: social  . Drug use: No  . Sexual activity: Not on file  Lifestyle  . Physical activity    Days per week: Not on file    Minutes per session: Not on file  . Stress: Not on file  Relationships  . Social Musicianconnections    Talks on phone: Not on file    Gets together: Not on file    Attends religious service: Not on file    Active member of club or organization: Not on file    Attends meetings of clubs or organizations: Not on file    Relationship status: Not on file  . Intimate partner violence    Fear of current or ex partner: Not on file    Emotionally abused: Not on file    Physically abused: Not on file    Forced sexual activity: Not on file  Other Topics Concern  . 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    Past Surgical History:  Procedure Laterality Date  . DILATION AND CURETTAGE OF UTERUS  2000  . lasix Bilateral 2012  . radioactive iodine ablation  10/2017   Dr, Buddy Duty  . WISDOM TOOTH EXTRACTION  1995    Family History  Problem Relation Age of Onset  . Breast cancer Maternal Grandmother        dx in her late 62's  . Ovarian cancer Mother        dx age 24  . Celiac disease Unknown        mom's side, great aunt  . Celiac disease Mother   . Colon cancer Neg Hx     Allergies  Allergen Reactions  . Methimazole Itching and Rash  . Propylthiouracil Hives and Itching    Current Outpatient Medications on File Prior to Visit  Medication Sig Dispense Refill  . ALPRAZolam  (XANAX) 0.25 MG tablet TAKE 1 TABLET BY MOUTH AS NEEDED BEFORE FLYING 30 tablet 0  . hydrochlorothiazide (HYDRODIURIL) 25 MG tablet Take 1 tablet (25 mg total) by mouth daily. 90 tablet 1  . levothyroxine (SYNTHROID, LEVOTHROID) 175 MCG tablet Take 1 tablet by mouth every morning.  4  . pantoprazole (PROTONIX) 40 MG tablet TAKE 1 TABLET BY MOUTH EVERY MORNING 30 MINUTES PRIOR TO BREAKFAST 30 tablet 0   No current facility-administered medications on file prior to visit.     BP 132/86 (BP Location: Right Arm, Patient Position: Sitting, Cuff Size: Large)   Pulse 67   Temp 98.7 F (37.1 C) (Oral)   Resp 16   Ht 5\' 7"  (1.702 m)   Wt 240 lb (108.9 kg)   SpO2 100%   BMI 37.59 kg/m       Objective:   Physical Exam  Physical Exam  Constitutional: She is oriented to person, place, and time. She appears well-developed and well-nourished. No distress.  HENT:  Head: Normocephalic and atraumatic.  Right Ear: Tympanic membrane and ear canal normal.  Left Ear: Tympanic membrane and ear canal normal.  Mouth/Throat: Oropharynx is clear and moist.  Eyes: Pupils are equal, round, and reactive to light. No scleral icterus.  Neck: Normal range of motion. No thyromegaly present.  Cardiovascular: Normal rate and regular rhythm.   No murmur heard. Pulmonary/Chest: Effort normal and breath sounds normal. No respiratory distress. He has no wheezes. She has no rales. She exhibits no tenderness.  Abdominal: Soft. Bowel sounds are normal. She exhibits no distension and no mass. There is no tenderness. There is no rebound and no guarding.  Musculoskeletal: She exhibits no edema.  Lymphadenopathy:    She has no cervical adenopathy.  Neurological: She is alert and oriented to person, place, and time. She has normal patellar reflexes. She exhibits normal muscle tone. Coordination normal.  Skin: Skin is warm and dry.  Psychiatric: She has a normal mood and affect. Her behavior is normal. Judgment and thought  content normal.  Breast/pelvic: deferred      Assessment & Plan:  Preventive care- mammogram and Pap are up-to-date per patient.  We will request copies of these reports from her gynecologist.  She declines tetanus shot.  Encouraged her to obtain a flu shot this fall.  Will obtain routine lab work.       Assessment & Plan:

## 2019-01-19 NOTE — Patient Instructions (Signed)
Please complete lab work prior to leaving. Continue to work on healthy diet, exercise and weight loss.  

## 2019-01-21 DIAGNOSIS — Z1239 Encounter for other screening for malignant neoplasm of breast: Secondary | ICD-10-CM | POA: Diagnosis not present

## 2019-01-21 DIAGNOSIS — Z1231 Encounter for screening mammogram for malignant neoplasm of breast: Secondary | ICD-10-CM | POA: Diagnosis not present

## 2019-01-22 ENCOUNTER — Encounter: Payer: Self-pay | Admitting: Family

## 2019-01-23 ENCOUNTER — Other Ambulatory Visit: Payer: Self-pay | Admitting: Family

## 2019-01-25 NOTE — Progress Notes (Signed)
Erroneous encounter

## 2019-01-31 ENCOUNTER — Encounter: Payer: Self-pay | Admitting: Family

## 2019-02-03 ENCOUNTER — Other Ambulatory Visit: Payer: Self-pay | Admitting: Family

## 2019-02-09 DIAGNOSIS — Z30433 Encounter for removal and reinsertion of intrauterine contraceptive device: Secondary | ICD-10-CM | POA: Diagnosis not present

## 2019-02-09 DIAGNOSIS — Z3202 Encounter for pregnancy test, result negative: Secondary | ICD-10-CM | POA: Diagnosis not present

## 2019-02-24 ENCOUNTER — Other Ambulatory Visit: Payer: Self-pay | Admitting: Family

## 2019-02-24 MED ORDER — ALPRAZOLAM 0.25 MG PO TABS: ORAL_TABLET | ORAL | 0 refills | Status: DC

## 2019-03-02 DIAGNOSIS — E890 Postprocedural hypothyroidism: Secondary | ICD-10-CM | POA: Diagnosis not present

## 2019-04-24 ENCOUNTER — Other Ambulatory Visit: Payer: Self-pay | Admitting: Family

## 2019-05-10 DIAGNOSIS — Z30431 Encounter for routine checking of intrauterine contraceptive device: Secondary | ICD-10-CM | POA: Diagnosis not present

## 2019-06-03 DIAGNOSIS — E0500 Thyrotoxicosis with diffuse goiter without thyrotoxic crisis or storm: Secondary | ICD-10-CM | POA: Diagnosis not present

## 2019-07-23 ENCOUNTER — Ambulatory Visit: Payer: BC Managed Care – PPO | Admitting: Family

## 2019-07-23 ENCOUNTER — Other Ambulatory Visit: Payer: Self-pay

## 2019-07-23 ENCOUNTER — Encounter: Payer: Self-pay | Admitting: Family

## 2019-07-23 VITALS — BP 120/76 | HR 70 | Temp 96.2°F | Resp 16 | Ht 68.0 in | Wt 221.0 lb

## 2019-07-23 DIAGNOSIS — Z1152 Encounter for screening for COVID-19: Secondary | ICD-10-CM

## 2019-07-23 DIAGNOSIS — F40243 Fear of flying: Secondary | ICD-10-CM

## 2019-07-23 DIAGNOSIS — I10 Essential (primary) hypertension: Secondary | ICD-10-CM | POA: Diagnosis not present

## 2019-07-23 LAB — BASIC METABOLIC PANEL
BUN: 17 mg/dL (ref 6–23)
CO2: 29 mEq/L (ref 19–32)
Calcium: 9.4 mg/dL (ref 8.4–10.5)
Chloride: 102 mEq/L (ref 96–112)
Creatinine, Ser: 1.01 mg/dL (ref 0.40–1.20)
GFR: 59.25 mL/min — ABNORMAL LOW (ref >60.00)
Glucose, Bld: 91 mg/dL (ref 70–99)
Potassium: 4.4 mEq/L (ref 3.5–5.1)
Sodium: 137 mEq/L (ref 135–145)

## 2019-07-23 LAB — SARS-COV-2 IGG: SARS-COV-2 IgG: 0.02

## 2019-07-23 MED ORDER — HYDROCHLOROTHIAZIDE 25 MG PO TABS: 25.0000 mg | ORAL_TABLET | Freq: Every day | ORAL | 1 refills | Status: DC

## 2019-07-23 NOTE — Progress Notes (Signed)
Subjective:    Patient ID: Lori Stone, female    DOB: 12/24/1975, 44 y.o.   MRN: [id number]  HPI  Patient is a 44 yr old female who presents today for follow up.   HTN- maintained on hctz.  BP Readings from Last 3 Encounters:  07/23/19 120/76  01/19/19 132/86  02/16/18 (!) 138/94   Hypothyroid- maintained on synthroid 175 mcg. She is followed by endocrinology (Dr. Sharl Ma).   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   GERD- maintained on protonix 40 mg daily PRN. Notes that if she eats high carb diet she needs it more.   Review of Systems See HPI  Past Medical History:  Diagnosis Date  . Anxiety   . GERD (gastroesophageal reflux disease)   . Hyperthyroidism      Social History   Socioeconomic History  . Marital status: Married    Spouse name: Not on file  . Number of children: 0  . Years of education: Not on file  . Highest education level: Not on file  Occupational History  . Occupation: Charity fundraiser  Tobacco Use  . Smoking status: Never Smoker  . Smokeless tobacco: Never Used  Substance and Sexual Activity  . Alcohol use: Yes    Alcohol/week: 2.0 standard drinks    Types: 2 Standard drinks or equivalent per week    Comment: social  . Drug use: No  . Sexual activity: Not on file  Other Topics Concern  . 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   Social Determinants of Health   Financial Resource Strain:   . Difficulty of Paying Living Expenses: Not on file  Food Insecurity:   . Worried About Programme researcher, broadcasting/film/video in the Last Year: Not on file  . Ran Out of Food in the Last Year: Not on file  Transportation Needs:   . Lack of Transportation (Medical): Not on file  . Lack of Transportation (Non-Medical): Not on file  Physical Activity:    . Days of Exercise per Week: Not on file  . Minutes of Exercise per Session: Not on file  Stress:   . Feeling of Stress : Not on file  Social Connections:   . Frequency of Communication with Friends and Family: Not on file  . Frequency of Social Gatherings with Friends and Family: Not on file  . Attends Religious Services: Not on file  . Active Member of Clubs or Organizations: Not on file  . Attends Banker Meetings: Not on file  . Marital Status: Not on file  Intimate Partner Violence:   . Fear of Current or Ex-Partner: Not on file  . Emotionally Abused: Not on file  . Physically Abused: Not on file  . Sexually Abused: Not on file    Past Surgical History:  Procedure Laterality Date  . DILATION AND CURETTAGE OF UTERUS  2000  . lasix Bilateral 2012  . radioactive iodine ablation  10/2017   Dr, Sharl Ma  . WISDOM TOOTH EXTRACTION  1995    Family History  Problem Relation Age of Onset  . Breast cancer Maternal Grandmother        dx in her late 36's  .  Ovarian cancer Mother        dx age 52  . Celiac disease Unknown        mom's side, great aunt  . Celiac disease Mother   . Colon cancer Neg Hx     Allergies  Allergen Reactions  . Methimazole Itching and Rash  . Propylthiouracil Hives and Itching    Current Outpatient Medications on File Prior to Visit  Medication Sig Dispense Refill  . ALPRAZolam (XANAX) 0.25 MG tablet TAKE 1 TABLET BY MOUTH AS NEEDED BEFORE FLYING 30 tablet 0  . hydrochlorothiazide (HYDRODIURIL) 25 MG tablet TAKE 1 TABLET BY MOUTH EVERY DAY 90 tablet 1  . levothyroxine (SYNTHROID, LEVOTHROID) 175 MCG tablet Take 1 tablet by mouth every morning.  4  . pantoprazole (PROTONIX) 40 MG tablet TAKE 1 TABLET BY MOUTH EVERY MORNING 30 MINUTES BEFORE BREAKFAST 90 tablet 1   No current facility-administered medications on file prior to visit.    BP 120/76 (BP Location: Right Arm, Patient Position: Sitting, Cuff Size: Large)   Pulse 70   Temp  (!) 96.2 F (35.7 C) (Temporal)   Resp 16   Ht 5\' 8"  (1.727 m)   Wt 221 lb (100.2 kg)   SpO2 100%   BMI 33.60 kg/m       Objective:   Physical Exam Constitutional:      Appearance: She is well-developed.  Neck:     Thyroid: No thyromegaly.  Cardiovascular:     Rate and Rhythm: Normal rate and regular rhythm.     Heart sounds: Normal heart sounds. No murmur.  Pulmonary:     Effort: Pulmonary effort is normal. No respiratory distress.     Breath sounds: Normal breath sounds. No wheezing.  Musculoskeletal:     Cervical back: Neck supple.  Skin:    General: Skin is warm and dry.  Neurological:     Mental Status: She is alert and oriented to person, place, and time.  Psychiatric:        Behavior: Behavior normal.        Thought Content: Thought content normal.        Judgment: Judgment normal.           Assessment & Plan:  HTN- bp stable. Continue current dose HCTZ. Obtain follow up bmet.   Fear of flying- continue prn xanax.  Obtain follow up UDS, controlled substance contract is updated today.  Screening for covid-19- pt reports that she was sick last February with anosmia, would like covid antibody checked.  Order placed.

## 2019-07-24 ENCOUNTER — Encounter: Payer: Self-pay | Admitting: Family

## 2019-07-24 LAB — PAIN MGMT, PROFILE 8 W/CONF, U
6 Acetylmorphine: NEGATIVE ng/mL
Alcohol Metabolites: NEGATIVE ng/mL (ref <500)
Amphetamines: NEGATIVE ng/mL
Benzodiazepines: NEGATIVE ng/mL
Buprenorphine, Urine: NEGATIVE ng/mL
Cocaine Metabolite: NEGATIVE ng/mL
Creatinine: 125.5 mg/dL
MDMA: NEGATIVE ng/mL
Marijuana Metabolite: NEGATIVE ng/mL
Opiates: NEGATIVE ng/mL
Oxidant: NEGATIVE ug/mL
Oxycodone: NEGATIVE ng/mL
pH: 7.2 (ref 4.5–9.0)

## 2019-08-18 ENCOUNTER — Other Ambulatory Visit: Payer: Self-pay | Admitting: Family

## 2019-10-02 DIAGNOSIS — Z23 Encounter for immunization: Secondary | ICD-10-CM | POA: Diagnosis not present

## 2019-10-18 ENCOUNTER — Encounter: Payer: Self-pay | Admitting: Family

## 2019-10-19 MED ORDER — ALPRAZOLAM 0.25 MG PO TABS: ORAL_TABLET | ORAL | 0 refills | Status: DC

## 2019-12-31 DIAGNOSIS — E890 Postprocedural hypothyroidism: Secondary | ICD-10-CM | POA: Diagnosis not present

## 2020-02-05 DIAGNOSIS — Z20822 Contact with and (suspected) exposure to covid-19: Secondary | ICD-10-CM | POA: Diagnosis not present

## 2020-02-07 DIAGNOSIS — Z20822 Contact with and (suspected) exposure to covid-19: Secondary | ICD-10-CM | POA: Diagnosis not present

## 2020-02-07 DIAGNOSIS — Z03818 Encounter for observation for suspected exposure to other biological agents ruled out: Secondary | ICD-10-CM | POA: Diagnosis not present

## 2020-02-14 ENCOUNTER — Encounter: Payer: Self-pay | Admitting: Family

## 2020-02-14 MED ORDER — ALPRAZOLAM 0.25 MG PO TABS: ORAL_TABLET | ORAL | 0 refills | Status: DC

## 2020-02-24 DIAGNOSIS — Z1231 Encounter for screening mammogram for malignant neoplasm of breast: Secondary | ICD-10-CM | POA: Diagnosis not present

## 2020-03-17 DIAGNOSIS — Z975 Presence of (intrauterine) contraceptive device: Secondary | ICD-10-CM | POA: Diagnosis not present

## 2020-03-17 DIAGNOSIS — Z01419 Encounter for gynecological examination (general) (routine) without abnormal findings: Secondary | ICD-10-CM | POA: Diagnosis not present

## 2020-03-17 DIAGNOSIS — N921 Excessive and frequent menstruation with irregular cycle: Secondary | ICD-10-CM | POA: Diagnosis not present

## 2020-03-20 ENCOUNTER — Other Ambulatory Visit: Payer: Self-pay

## 2020-03-20 ENCOUNTER — Ambulatory Visit: Payer: BC Managed Care – PPO | Admitting: Family

## 2020-03-20 ENCOUNTER — Encounter: Payer: Self-pay | Admitting: Family

## 2020-03-20 VITALS — BP 106/76 | HR 72 | Resp 16 | Ht 68.0 in | Wt 236.0 lb

## 2020-03-20 DIAGNOSIS — G43909 Migraine, unspecified, not intractable, without status migrainosus: Secondary | ICD-10-CM | POA: Diagnosis not present

## 2020-03-20 DIAGNOSIS — R928 Other abnormal and inconclusive findings on diagnostic imaging of breast: Secondary | ICD-10-CM | POA: Diagnosis not present

## 2020-03-20 MED ORDER — SUMATRIPTAN SUCCINATE 50 MG PO TABS: ORAL_TABLET | ORAL | 5 refills | Status: DC

## 2020-03-20 NOTE — Progress Notes (Signed)
Subjective:    Patient ID: Lori Stone, female    DOB: 1975-07-05, 44 y.o.   MRN: [id number]  HPI  Patient is a 44 yr old female who presents today with chief compliant of recurrent migraines.  Pt reports migraine on 9/3, 9/14, and 9/19. Pain starts in the lower back of her head and then comes across the top of her head.  Reports that she has associated nausea without vomiting.  Reports + photophobia.  She uses advil migraine which only helps, but only if she goes to sleep. If she sleeps 2 hrs wakes and then wakes up she is "ok." Denies visual auras.      Review of Systems See HPI  Past Medical History:  Diagnosis Date  . Anxiety   . GERD (gastroesophageal reflux disease)   . Hyperthyroidism      Social History   Socioeconomic History  . Marital status: Married    Spouse name: Not on file  . Number of children: 0  . Years of education: Not on file  . Highest education level: Not on file  Occupational History  . Occupation: Charity fundraiser  Tobacco Use  . Smoking status: Never Smoker  . Smokeless tobacco: Never Used  Substance and Sexual Activity  . Alcohol use: Yes    Alcohol/week: 2.0 standard drinks    Types: 2 Standard drinks or equivalent per week    Comment: social  . Drug use: No  . Sexual activity: Not on file  Other Topics Concern  . 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   Social Determinants of Health   Financial Resource Strain:   . Difficulty of Paying Living Expenses: Not on file  Food Insecurity:   . Worried About Programme researcher, broadcasting/film/video in the Last Year: Not on file  . Ran Out of Food in the Last Year: Not on file  Transportation Needs:   . Lack of Transportation (Medical): Not on file  . Lack of Transportation (Non-Medical): Not on file    Physical Activity:   . Days of Exercise per Week: Not on file  . Minutes of Exercise per Session: Not on file  Stress:   . Feeling of Stress : Not on file  Social Connections:   . Frequency of Communication with Friends and Family: Not on file  . Frequency of Social Gatherings with Friends and Family: Not on file  . Attends Religious Services: Not on file  . Active Member of Clubs or Organizations: Not on file  . Attends Banker Meetings: Not on file  . Marital Status: Not on file  Intimate Partner Violence:   . Fear of Current or Ex-Partner: Not on file  . Emotionally Abused: Not on file  . Physically Abused: Not on file  . Sexually Abused: Not on file    Past Surgical History:  Procedure Laterality Date  . DILATION AND CURETTAGE OF UTERUS  2000  . lasix Bilateral 2012  . radioactive iodine ablation  10/2017   Dr, Sharl Ma  . WISDOM TOOTH EXTRACTION  1995    Family History  Problem Relation Age of Onset  . Breast cancer Maternal Grandmother  dx in her late 19's  . Ovarian cancer Mother        dx age 25  . Celiac disease Unknown        mom's side, great aunt  . Celiac disease Mother   . Colon cancer Neg Hx     Allergies  Allergen Reactions  . Methimazole Itching and Rash  . Propylthiouracil Hives and Itching    Current Outpatient Medications on File Prior to Visit  Medication Sig Dispense Refill  . ALPRAZolam (XANAX) 0.25 MG tablet TAKE 1 TABLET BY MOUTH AS NEEDED BEFORE FLYING 30 tablet 0  . hydrochlorothiazide (HYDRODIURIL) 25 MG tablet Take 1 tablet (25 mg total) by mouth daily. 90 tablet 1  . levothyroxine (SYNTHROID, LEVOTHROID) 175 MCG tablet Take 1 tablet by mouth every morning.  4  . pantoprazole (PROTONIX) 40 MG tablet TAKE 1 TABLET BY MOUTH EVERY MORNING 30 MINUTES BEFORE BREAKFAST 90 tablet 1   No current facility-administered medications on file prior to visit.    BP 106/76 (BP Location: Left Arm, Patient Position: Sitting, Cuff  Size: Normal)   Pulse 72   Resp 16   Ht 5\' 8"  (1.727 m)   Wt 236 lb (107 kg)   SpO2 99%   BMI 35.88 kg/m       Objective:   Physical Exam Constitutional:      Appearance: She is well-developed.  Eyes:     Extraocular Movements: Extraocular movements intact.     Right eye: Normal extraocular motion.     Left eye: Normal extraocular motion.     Conjunctiva/sclera: Conjunctivae normal.     Pupils: Pupils are equal, round, and reactive to light.  Neck:     Thyroid: No thyromegaly.  Cardiovascular:     Rate and Rhythm: Normal rate and regular rhythm.     Heart sounds: Normal heart sounds. No murmur heard.   Pulmonary:     Effort: Pulmonary effort is normal. No respiratory distress.     Breath sounds: Normal breath sounds. No wheezing.  Musculoskeletal:     Cervical back: Neck supple.  Skin:    General: Skin is warm and dry.  Neurological:     General: No focal deficit present.     Mental Status: She is alert and oriented to person, place, and time.     Cranial Nerves: No cranial nerve deficit.     Motor: No weakness.  Psychiatric:        Behavior: Behavior normal.        Thought Content: Thought content normal.        Judgment: Judgment normal.           Assessment & Plan:  Migraines- uncontrolled Will initiate prn imitex (see orders). Pt will follow up in 6 weeks for re-evaluation. If she continues to have frequent migraines at that time, would consider addition of a medication for migraine prophylaxis.   This visit occurred during the SARS-CoV-2 public health emergency.  Safety protocols were in place, including screening questions prior to the visit, additional usage of staff PPE, and extensive cleaning of exam room while observing appropriate contact time as indicated for disinfecting solutions.

## 2020-03-20 NOTE — Patient Instructions (Signed)
Please begin imitrex as needed for migraine.

## 2020-06-04 ENCOUNTER — Other Ambulatory Visit: Payer: Self-pay | Admitting: Family

## 2020-06-05 MED ORDER — ALPRAZOLAM 0.25 MG PO TABS: ORAL_TABLET | ORAL | 0 refills | Status: DC

## 2020-06-05 NOTE — Telephone Encounter (Signed)
Last written: 03/20/20  Last ov: 02/14/20 Next ov: none Contract: 07/23/19 UDS: 07/23/19

## 2020-07-30 DIAGNOSIS — J029 Acute pharyngitis, unspecified: Secondary | ICD-10-CM | POA: Diagnosis not present

## 2020-08-07 ENCOUNTER — Other Ambulatory Visit: Payer: Self-pay

## 2020-08-07 ENCOUNTER — Ambulatory Visit: Payer: BC Managed Care – PPO | Admitting: Family

## 2020-08-07 ENCOUNTER — Telehealth: Payer: Self-pay

## 2020-08-07 VITALS — BP 122/70 | HR 72 | Temp 97.6°F | Wt 235.1 lb

## 2020-08-07 DIAGNOSIS — I10 Essential (primary) hypertension: Secondary | ICD-10-CM | POA: Diagnosis not present

## 2020-08-07 DIAGNOSIS — F40243 Fear of flying: Secondary | ICD-10-CM

## 2020-08-07 DIAGNOSIS — E669 Obesity, unspecified: Secondary | ICD-10-CM | POA: Diagnosis not present

## 2020-08-07 DIAGNOSIS — E039 Hypothyroidism, unspecified: Secondary | ICD-10-CM

## 2020-08-07 DIAGNOSIS — J358 Other chronic diseases of tonsils and adenoids: Secondary | ICD-10-CM | POA: Diagnosis not present

## 2020-08-07 LAB — BASIC METABOLIC PANEL
BUN: 14 mg/dL (ref 6–23)
CO2: 29 mEq/L (ref 19–32)
Calcium: 9.4 mg/dL (ref 8.4–10.5)
Chloride: 102 mEq/L (ref 96–112)
Creatinine, Ser: 1.03 mg/dL (ref 0.40–1.20)
GFR: 65.39 mL/min (ref >60.00)
Glucose, Bld: 82 mg/dL (ref 70–99)
Potassium: 4 mEq/L (ref 3.5–5.1)
Sodium: 138 mEq/L (ref 135–145)

## 2020-08-07 MED ORDER — OZEMPIC (0.25 OR 0.5 MG/DOSE) 2 MG/1.5ML ~~LOC~~ SOPN: 0.2500 mg | PEN_INJECTOR | SUBCUTANEOUS | 0 refills | Status: DC

## 2020-08-07 NOTE — Progress Notes (Signed)
Subjective:    Patient ID: Lori Stone, female    DOB: [birth year], 45 y.o.   MRN: [id number]  HPI  Patient is a 45 yr old female who presents today for follow up.  Migraines- last visit we added a trial of prn imitrex. Reports that she continues to hav  HTN- pt is maintained on hctz 25mg  once daily.  BP Readings from Last 3 Encounters:  08/07/20 122/70  03/20/20 106/76  07/23/19 120/76   She reports that she gets tonsil stones.    Wt Readings from Last 3 Encounters:  08/07/20 235 lb 2 oz (106.7 kg)  03/20/20 236 lb (107 kg)  07/23/19 221 lb (100.2 kg)   Hypothyroid- following with Dr. 07/25/19.    Obesity- Reports that she feels like her weight has finally stabilized following her thyroid ablation.  Review of Systems    see HPI  Past Medical History:  Diagnosis Date  . Anxiety   . GERD (gastroesophageal reflux disease)   . Hyperthyroidism      Social History   Socioeconomic History  . Marital status: Married    Spouse name: Not on file  . Number of children: 0  . Years of education: Not on file  . Highest education level: Not on file  Occupational History  . Occupation: [employer]  Tobacco Use  . Smoking status: Never Smoker  . Smokeless tobacco: Never Used  Substance and Sexual Activity  . Alcohol use: Yes    Alcohol/week: 2.0 standard drinks    Types: 2 Standard drinks or equivalent per week    Comment: social  . Drug use: No  . Sexual activity: Not on file  Other Topics Concern  . 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   Social Determinants of Health   Financial Resource Strain: Not on file  Food Insecurity: Not on file  Transportation Needs: Not on file  Physical Activity: Not on file  Stress: Not on file  Social Connections: Not  on file  Intimate Partner Violence: Not on file    Past Surgical History:  Procedure Laterality Date  . DILATION AND CURETTAGE OF UTERUS  2000  . lasix Bilateral 2012  . radioactive iodine ablation  10/2017   Dr, 11/2017  . WISDOM TOOTH EXTRACTION  1995    Family History  Problem Relation Age of Onset  . Breast cancer Maternal Grandmother        dx in her late 55's  . Ovarian cancer Mother        dx age 73  . Celiac disease Other        mom's side, great aunt  . Celiac disease Mother   . Colon cancer Neg Hx     Allergies  Allergen Reactions  . Methimazole Itching and Rash  . Propylthiouracil Hives and Itching    Current Outpatient Medications on File Prior to Visit  Medication Sig Dispense Refill  . ALPRAZolam (XANAX) 0.25 MG tablet TAKE 1 TABLET BY MOUTH AS NEEDED BEFORE FLYING 30 tablet 0  . hydrochlorothiazide (HYDRODIURIL) 25 MG tablet Take 1 tablet (25 mg total) by mouth daily. 90 tablet 1  . levothyroxine (SYNTHROID, LEVOTHROID) 175 MCG tablet Take 1 tablet  by mouth every morning.  4  . SUMAtriptan (IMITREX) 50 MG tablet Take 1 tablet by mouth as needed for migraine, may repeat in 2 hrs if needed. 10 tablet 5   No current facility-administered medications on file prior to visit.    BP 122/70 (BP Location: Right Arm, Patient Position: Sitting, Cuff Size: Normal)   Pulse 72   Temp 97.6 F (36.4 C) (Oral)   Wt 235 lb 2 oz (106.7 kg)   SpO2 99%   BMI 35.75 kg/m    Objective:   Physical Exam Constitutional:      Appearance: She is well-developed and well-nourished.  HENT:     Head:     Comments: Mildly cryptic tonsils. No visible tonsil stones today. Cardiovascular:     Rate and Rhythm: Normal rate and regular rhythm.     Heart sounds: Normal heart sounds. No murmur heard.   Pulmonary:     Effort: Pulmonary effort is normal. No respiratory distress.     Breath sounds: Normal breath sounds. No wheezing.  Psychiatric:        Mood and Affect: Mood and  affect normal.        Behavior: Behavior normal.        Thought Content: Thought content normal.        Judgment: Judgment normal.           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  Hypothyroidism- clinically stable on synthroid, managed by Endo.  HTN- bp stable on hctz 25mg  once daily. Continue same, obtain follow up bmet.  Fear of flying- UDS will be updated, controlled substance contract updated. Continue xanax prn.  Obesity- she is working hard on diet and exercise but states she is really having trouble losing weight. Will give her trial of Ozempic if her insurance covers it.  This visit occurred during the SARS-CoV-2 public health emergency.  Safety protocols were in place, including screening questions prior to the visit, additional usage of staff PPE, and extensive cleaning of exam room while observing appropriate contact time as indicated for disinfecting solutions.

## 2020-08-07 NOTE — Patient Instructions (Signed)
Please schedule a nurse visit for Ozempic injection teaching once you get your prescription. Continue your work with healthy diet, exercise. Complete lab work prior to leaving.

## 2020-08-07 NOTE — Telephone Encounter (Signed)
PA initiated via Covermymeds; KEY: [id number]. Awaiting determination.

## 2020-08-08 ENCOUNTER — Telehealth: Payer: Self-pay

## 2020-08-08 LAB — DRUG MONITORING, PANEL 8 WITH CONFIRMATION, URINE
6 Acetylmorphine: NEGATIVE ng/mL (ref <10)
Alcohol Metabolites: NEGATIVE ng/mL
Amphetamines: NEGATIVE ng/mL (ref <500)
Benzodiazepines: NEGATIVE ng/mL (ref <100)
Buprenorphine, Urine: NEGATIVE ng/mL (ref <5)
Cocaine Metabolite: NEGATIVE ng/mL (ref <150)
Creatinine: 146.2 mg/dL
MDMA: NEGATIVE ng/mL (ref <500)
Marijuana Metabolite: NEGATIVE ng/mL (ref <20)
Opiates: NEGATIVE ng/mL (ref <100)
Oxidant: NEGATIVE ug/mL
Oxycodone: NEGATIVE ng/mL (ref <100)
pH: 6.6 (ref 4.5–9.0)

## 2020-08-08 LAB — DM TEMPLATE

## 2020-08-08 MED ORDER — CONTRAVE 8-90 MG PO TB12: ORAL_TABLET | ORAL | 0 refills | Status: DC

## 2020-08-08 MED ORDER — HYDROCHLOROTHIAZIDE 25 MG PO TABS: 25.0000 mg | ORAL_TABLET | Freq: Every day | ORAL | 1 refills | Status: DC

## 2020-08-08 NOTE — Telephone Encounter (Signed)
PA denied. Medication is not FDA approved for obesity/weight loss only type 2 diabetes.

## 2020-08-08 NOTE — Addendum Note (Signed)
Addended by: Sandford Craze on: 08/08/2020 12:33 PM   Modules accepted: Orders

## 2020-08-08 NOTE — Telephone Encounter (Signed)
PA initiated via Covermymeds; KEY: [id number]. Awaiting determination.

## 2020-08-09 NOTE — Telephone Encounter (Signed)
PA approved. Effective from 08/08/2020 through 02/03/2021.

## 2020-08-23 ENCOUNTER — Encounter: Payer: Self-pay | Admitting: Family

## 2020-08-23 MED ORDER — SUMATRIPTAN SUCCINATE 50 MG PO TABS: ORAL_TABLET | ORAL | 5 refills | Status: DC

## 2020-09-11 ENCOUNTER — Ambulatory Visit: Payer: BC Managed Care – PPO | Admitting: Family

## 2020-09-18 DIAGNOSIS — R928 Other abnormal and inconclusive findings on diagnostic imaging of breast: Secondary | ICD-10-CM | POA: Diagnosis not present

## 2020-09-18 DIAGNOSIS — R922 Inconclusive mammogram: Secondary | ICD-10-CM | POA: Diagnosis not present

## 2020-11-02 ENCOUNTER — Encounter: Payer: Self-pay | Admitting: Family

## 2020-11-02 MED ORDER — ALPRAZOLAM 0.25 MG PO TABS: ORAL_TABLET | ORAL | 0 refills | Status: DC

## 2020-11-02 NOTE — Telephone Encounter (Signed)
Requesting: xanax Contract:08/10/2020 UDS:08/07/2020 Last Visit:08/07/2020 Next Visit:n/a Last Refill:06/05/2020  Please Advise

## 2020-11-29 ENCOUNTER — Ambulatory Visit: Payer: BC Managed Care – PPO | Admitting: Family

## 2021-01-26 ENCOUNTER — Encounter: Payer: Self-pay | Admitting: Family

## 2021-01-26 MED ORDER — SUMATRIPTAN SUCCINATE 50 MG PO TABS: ORAL_TABLET | ORAL | 5 refills | Status: DC

## 2021-03-17 ENCOUNTER — Encounter: Payer: Self-pay | Admitting: Family

## 2021-03-19 MED ORDER — ALPRAZOLAM 0.25 MG PO TABS: ORAL_TABLET | ORAL | 0 refills | Status: DC

## 2021-03-19 NOTE — Telephone Encounter (Signed)
Requesting: alprazolam 0.25mg  Contract: 07/23/2019 UDS: 08/07/2020 Last Visit: 08/07/2020  Next Visit: None Last Refill: 11/02/2020 #30 and 0RF  Please Advise

## 2021-03-29 DIAGNOSIS — Z01411 Encounter for gynecological examination (general) (routine) with abnormal findings: Secondary | ICD-10-CM | POA: Diagnosis not present

## 2021-03-29 DIAGNOSIS — N921 Excessive and frequent menstruation with irregular cycle: Secondary | ICD-10-CM | POA: Diagnosis not present

## 2021-03-29 DIAGNOSIS — N939 Abnormal uterine and vaginal bleeding, unspecified: Secondary | ICD-10-CM | POA: Diagnosis not present

## 2021-03-29 DIAGNOSIS — Z975 Presence of (intrauterine) contraceptive device: Secondary | ICD-10-CM | POA: Diagnosis not present

## 2021-04-02 DIAGNOSIS — R922 Inconclusive mammogram: Secondary | ICD-10-CM | POA: Diagnosis not present

## 2021-04-02 DIAGNOSIS — R928 Other abnormal and inconclusive findings on diagnostic imaging of breast: Secondary | ICD-10-CM | POA: Diagnosis not present

## 2021-04-02 DIAGNOSIS — N6489 Other specified disorders of breast: Secondary | ICD-10-CM | POA: Diagnosis not present

## 2021-04-02 LAB — HM MAMMOGRAPHY

## 2021-05-11 DIAGNOSIS — E0590 Thyrotoxicosis, unspecified without thyrotoxic crisis or storm: Secondary | ICD-10-CM | POA: Diagnosis not present

## 2021-05-14 DIAGNOSIS — E890 Postprocedural hypothyroidism: Secondary | ICD-10-CM | POA: Diagnosis not present

## 2021-05-14 DIAGNOSIS — E0500 Thyrotoxicosis with diffuse goiter without thyrotoxic crisis or storm: Secondary | ICD-10-CM | POA: Diagnosis not present

## 2021-05-16 DIAGNOSIS — Z8041 Family history of malignant neoplasm of ovary: Secondary | ICD-10-CM | POA: Diagnosis not present

## 2021-05-16 DIAGNOSIS — N938 Other specified abnormal uterine and vaginal bleeding: Secondary | ICD-10-CM | POA: Diagnosis not present

## 2021-05-16 DIAGNOSIS — N921 Excessive and frequent menstruation with irregular cycle: Secondary | ICD-10-CM | POA: Diagnosis not present

## 2021-05-16 DIAGNOSIS — Z975 Presence of (intrauterine) contraceptive device: Secondary | ICD-10-CM | POA: Diagnosis not present

## 2021-05-16 DIAGNOSIS — T385X5A Adverse effect of other estrogens and progestogens, initial encounter: Secondary | ICD-10-CM | POA: Diagnosis not present

## 2021-05-16 DIAGNOSIS — N926 Irregular menstruation, unspecified: Secondary | ICD-10-CM | POA: Diagnosis not present

## 2021-07-11 DIAGNOSIS — N83202 Unspecified ovarian cyst, left side: Secondary | ICD-10-CM | POA: Diagnosis not present

## 2021-07-11 DIAGNOSIS — N921 Excessive and frequent menstruation with irregular cycle: Secondary | ICD-10-CM | POA: Diagnosis not present

## 2021-07-11 DIAGNOSIS — Z975 Presence of (intrauterine) contraceptive device: Secondary | ICD-10-CM | POA: Diagnosis not present

## 2021-07-11 DIAGNOSIS — Z8041 Family history of malignant neoplasm of ovary: Secondary | ICD-10-CM | POA: Diagnosis not present

## 2021-07-18 ENCOUNTER — Encounter: Payer: Self-pay | Admitting: Family

## 2021-07-19 ENCOUNTER — Other Ambulatory Visit: Payer: Self-pay

## 2021-07-19 DIAGNOSIS — J358 Other chronic diseases of tonsils and adenoids: Secondary | ICD-10-CM

## 2021-08-06 ENCOUNTER — Ambulatory Visit: Payer: BC Managed Care – PPO | Admitting: Family

## 2021-08-06 VITALS — BP 128/84 | HR 87 | Temp 97.6°F | Resp 16 | Wt 217.0 lb

## 2021-08-06 DIAGNOSIS — I10 Essential (primary) hypertension: Secondary | ICD-10-CM

## 2021-08-06 DIAGNOSIS — E785 Hyperlipidemia, unspecified: Secondary | ICD-10-CM

## 2021-08-06 DIAGNOSIS — F40243 Fear of flying: Secondary | ICD-10-CM | POA: Diagnosis not present

## 2021-08-06 DIAGNOSIS — J020 Streptococcal pharyngitis: Secondary | ICD-10-CM

## 2021-08-06 DIAGNOSIS — G43909 Migraine, unspecified, not intractable, without status migrainosus: Secondary | ICD-10-CM

## 2021-08-06 LAB — POCT RAPID STREP A (OFFICE): Rapid Strep A Screen: POSITIVE — AB

## 2021-08-06 MED ORDER — AMOXICILLIN 500 MG PO CAPS: 500.0000 mg | ORAL_CAPSULE | Freq: Three times a day (TID) | ORAL | 0 refills | Status: AC

## 2021-08-06 MED ORDER — ALPRAZOLAM 0.25 MG PO TABS: ORAL_TABLET | ORAL | 0 refills | Status: DC

## 2021-08-06 NOTE — Assessment & Plan Note (Signed)
New. Will rx with amoxicillin. She has upcoming appointment scheduled with ENT.

## 2021-08-06 NOTE — Progress Notes (Signed)
Subjective:   By signing my name below, I, Zite Okoli, attest that this documentation has been prepared under the direction and in the presence of Debbrah Alar, NP 08/06/2021       Patient ID: Lori Stone, female    DOB: 26-Jan-1976, 46 y.o.   MRN: [id number]  Chief Complaint  Patient presents with   Hypertension    Here for follow up   Hypothyroidism    Here for follow up    HPI Patient is in today for an office visit.  Recurrent tonsil stones- She does not have an ENT appointment till Match. She reports she still has a nagging cough and some voice hoarseness.    Anxiety- She still uses 0.25 mg xanax to help her with flying and reports she is doing well with it.  Hypertension- Her  blood pressure is stable at today's visit. Still using 25 mg HCTZ to manage the readings.  BP Readings from Last 3 Encounters:  08/06/21 128/84  08/07/20 122/70  03/20/20 106/76    Migraines- She reports she is still having migraines about once  a month and is still using 50 mg Imitrex to manage the pain.  Weight Loss- She had started contrave to help with weight loss but reports she was feeling very nauseous so she stopped the prescription.   Past Medical History:  Diagnosis Date   Anxiety    GERD (gastroesophageal reflux disease)    Hyperthyroidism     Past Surgical History:  Procedure Laterality Date   DILATION AND CURETTAGE OF UTERUS  2000   lasix Bilateral 2012   radioactive iodine ablation  10/2017   Dr, Buddy Duty   [place]    Family History  Problem Relation Age of Onset   Breast cancer Maternal Grandmother        dx in her late 54's   Ovarian cancer Mother        dx age 84   Celiac disease Other        mom's side, great aunt   Celiac disease Mother    Colon cancer Neg Hx     Social History   Socioeconomic History   Marital status: Married    Spouse name: Not on file   Number of children: 0   Years of education: Not on file   Highest  education level: Not on file  Occupational History   Occupation: English as a second language teacher  Tobacco Use   Smoking status: Never   Smokeless tobacco: Never  Substance and Sexual Activity   Alcohol use: Yes    Alcohol/week: 2.0 standard drinks    Types: 2 Standard drinks or equivalent per week    Comment: social   Drug use: No   Sexual activity: Not on file  Other Topics Concern   Not on file  Social History Narrative   Lives with husband   No children   Enjoys television, sporting events   Works as English as a second language teacher for [employer]   1 cat   Grew up in [place], has lived all over the country and also in [place] due to job moves.   Social Determinants of Health   Financial Resource Strain: Not on file  Food Insecurity: Not on file  Transportation Needs: Not on file  Physical Activity: Not on file  Stress: Not on file  Social Connections: Not on file  Intimate Partner Violence: Not on file    Outpatient Medications Prior to Visit  Medication Sig  Dispense Refill   hydrochlorothiazide (HYDRODIURIL) 25 MG tablet Take 1 tablet (25 mg total) by mouth daily. 90 tablet 1   levothyroxine (SYNTHROID, LEVOTHROID) 175 MCG tablet Take 1 tablet by mouth every morning.  4   SUMAtriptan (IMITREX) 50 MG tablet Take 1 tablet by mouth as needed for migraine, may repeat in 2 hrs if needed. 10 tablet 5   ALPRAZolam (XANAX) 0.25 MG tablet TAKE 1 TABLET BY MOUTH AS NEEDED BEFORE FLYING 30 tablet 0   Naltrexone-buPROPion HCl ER (CONTRAVE) 8-90 MG TB12 Start 1 tablet every morning for 7 days, then 1 tablet twice daily for 7 days, then 2 tablets every morning and one every evening 120 tablet 0   No facility-administered medications prior to visit.    Allergies  Allergen Reactions   Methimazole Itching and Rash   Propylthiouracil Hives and Itching   Contrave [Naltrexone-Bupropion Hcl Er]     Extreme nausea    Review of Systems  Constitutional:  Negative for fever.  HENT:  Negative for ear pain and hearing  loss.        (-)nystagmus (-)adenopathy  Eyes:  Negative for blurred vision.  Respiratory:  Positive for cough. Negative for shortness of breath and wheezing.   Cardiovascular:  Negative for chest pain and leg swelling.  Gastrointestinal:  Negative for blood in stool, diarrhea, nausea and vomiting.  Genitourinary:  Negative for dysuria and frequency.  Musculoskeletal:  Negative for joint pain and myalgias.  Skin:  Negative for rash.  Neurological:  Negative for headaches.       (+) migraines   Psychiatric/Behavioral:  Negative for depression. The patient is not nervous/anxious.       Objective:    Physical Exam Constitutional:      General: She is not in acute distress.    Appearance: Normal appearance. She is not ill-appearing.  HENT:     Head: Normocephalic and atraumatic.     Right Ear: External ear normal.     Left Ear: External ear normal.  Eyes:     Extraocular Movements: Extraocular movements intact.     Pupils: Pupils are equal, round, and reactive to light.  Cardiovascular:     Rate and Rhythm: Normal rate and regular rhythm.     Pulses: Normal pulses.     Heart sounds: Normal heart sounds. No murmur heard. Pulmonary:     Effort: Pulmonary effort is normal. No respiratory distress.     Breath sounds: Normal breath sounds. No wheezing or rhonchi.  Abdominal:     General: Bowel sounds are normal. There is no distension.     Palpations: Abdomen is soft.     Tenderness: There is no abdominal tenderness. There is no guarding or rebound.  Musculoskeletal:     Cervical back: Neck supple.  Lymphadenopathy:     Cervical: No cervical adenopathy.  Skin:    General: Skin is warm and dry.  Neurological:     Mental Status: She is alert and oriented to person, place, and time.  Psychiatric:        Behavior: Behavior normal.        Judgment: Judgment normal.    BP 128/84 (BP Location: Right Arm, Patient Position: Sitting, Cuff Size: Large)    Pulse 87    Temp 97.6 F  (36.4 C) (Oral)    Resp 16    Wt 217 lb (98.4 kg)    SpO2 100%    BMI 32.99 kg/m  Wt Readings from Last 3 Encounters:  08/06/21 217 lb (98.4 kg)  08/07/20 235 lb 2 oz (106.7 kg)  03/20/20 236 lb (107 kg)    Assessment & Plan:   Problem List Items Addressed This Visit       Unprioritized   Strep pharyngitis    New. Will rx with amoxicillin. She has upcoming appointment scheduled with ENT.       Relevant Orders   POCT rapid strep A (Completed)   Migraine without status migrainosus, not intractable    Stable, occurs about once a month and is relieved by imitrex prn.       Fear of flying    Flies once a week.  Continue prn xanax.  Controlled substance contract is updated. Obtain follow up UDS.       Relevant Medications   ALPRAZolam (XANAX) 0.25 MG tablet   Other Relevant Orders   Drug Monitoring Panel [phone] , Urine   Essential hypertension    BP Readings from Last 3 Encounters:  08/06/21 128/84  08/07/20 122/70  03/20/20 106/76  bp stable on hctz 64m once daily. Continue same.       Other Visit Diagnoses     Primary hypertension    -  Primary   Relevant Orders   Comp Met (CMET)   Hyperlipidemia, unspecified hyperlipidemia type       Relevant Orders   Lipid panel        Meds ordered this encounter  Medications   ALPRAZolam (XANAX) 0.25 MG tablet    Sig: TAKE 1 TABLET BY MOUTH AS NEEDED BEFORE FLYING    Dispense:  30 tablet    Refill:  0    Not to exceed 5 additional fills before 10/15/2017    Order Specific Question:   Supervising Provider    Answer:   BPenni HomansA [[id number]]   amoxicillin (AMOXIL) 500 MG capsule    Sig: Take 1 capsule (500 mg total) by mouth 3 (three) times daily for 10 days.    Dispense:  30 capsule    Refill:  0    Order Specific Question:   Supervising Provider    Answer:   BPenni HomansA [[[id number]]   I,Zite Okoli,acting as a scribe for MNance Pear NP.,have documented all relevant documentation on the behalf of MNance Pear NP,as directed by  MNance Pear NP while in the presence of MNance Pear NP.   I, ODebbrah Alar NP, personally preformed the services described in this documentation.  All medical record entries made by the scribe were at my direction and in my presence.  I have reviewed the chart and discharge instructions (if applicable) and agree that the record reflects my personal performance and is accurate and complete. 08/06/2021

## 2021-08-06 NOTE — Assessment & Plan Note (Signed)
Stable, occurs about once a month and is relieved by imitrex prn.

## 2021-08-06 NOTE — Assessment & Plan Note (Signed)
BP Readings from Last 3 Encounters:  08/06/21 128/84  08/07/20 122/70  03/20/20 106/76   bp stable on hctz 25mg  once daily. Continue same.

## 2021-08-06 NOTE — Assessment & Plan Note (Addendum)
Flies once a week.  Continue prn xanax.  Controlled substance contract is updated. Obtain follow up UDS.

## 2021-08-07 LAB — COMPREHENSIVE METABOLIC PANEL
ALT: 16 U/L (ref 0–35)
AST: 16 U/L (ref 0–37)
Albumin: 4.6 g/dL (ref 3.5–5.2)
Alkaline Phosphatase: 55 U/L (ref 39–117)
BUN: 13 mg/dL (ref 6–23)
CO2: 32 mEq/L (ref 19–32)
Calcium: 10 mg/dL (ref 8.4–10.5)
Chloride: 100 mEq/L (ref 96–112)
Creatinine, Ser: 1.08 mg/dL (ref 0.40–1.20)
GFR: 61.34 mL/min (ref >60.00)
Glucose, Bld: 76 mg/dL (ref 70–99)
Potassium: 4.3 mEq/L (ref 3.5–5.1)
Sodium: 138 mEq/L (ref 135–145)
Total Bilirubin: 0.4 mg/dL (ref 0.2–1.2)
Total Protein: 7.2 g/dL (ref 6.0–8.3)

## 2021-08-07 LAB — LIPID PANEL
Cholesterol: 221 mg/dL — ABNORMAL HIGH (ref 0–200)
HDL: 59.2 mg/dL (ref >39.00)
LDL Cholesterol: 131 mg/dL — ABNORMAL HIGH (ref 0–99)
NonHDL: 162.16
Total CHOL/HDL Ratio: 4
Triglycerides: 158 mg/dL — ABNORMAL HIGH (ref 0.0–149.0)
VLDL: 31.6 mg/dL (ref 0.0–40.0)

## 2021-08-08 LAB — DRUG MONITORING PANEL 376104, URINE
Amphetamines: NEGATIVE ng/mL (ref <500)
Barbiturates: NEGATIVE ng/mL (ref <300)
Benzodiazepines: NEGATIVE ng/mL (ref <100)
Cocaine Metabolite: NEGATIVE ng/mL (ref <150)
Desmethyltramadol: NEGATIVE ng/mL (ref <100)
Opiates: NEGATIVE ng/mL (ref <100)
Oxycodone: NEGATIVE ng/mL (ref <100)
Tramadol: NEGATIVE ng/mL (ref <100)

## 2021-08-08 LAB — DM TEMPLATE

## 2021-08-09 DIAGNOSIS — Z975 Presence of (intrauterine) contraceptive device: Secondary | ICD-10-CM | POA: Diagnosis not present

## 2021-08-09 DIAGNOSIS — Z8041 Family history of malignant neoplasm of ovary: Secondary | ICD-10-CM | POA: Diagnosis not present

## 2021-08-09 DIAGNOSIS — N83202 Unspecified ovarian cyst, left side: Secondary | ICD-10-CM | POA: Diagnosis not present

## 2021-08-09 DIAGNOSIS — Z30431 Encounter for routine checking of intrauterine contraceptive device: Secondary | ICD-10-CM | POA: Diagnosis not present

## 2021-09-03 DIAGNOSIS — K219 Gastro-esophageal reflux disease without esophagitis: Secondary | ICD-10-CM | POA: Diagnosis not present

## 2021-09-03 DIAGNOSIS — Z8619 Personal history of other infectious and parasitic diseases: Secondary | ICD-10-CM | POA: Diagnosis not present

## 2021-09-07 ENCOUNTER — Encounter: Payer: Self-pay | Admitting: Family

## 2021-09-07 ENCOUNTER — Ambulatory Visit (INDEPENDENT_AMBULATORY_CARE_PROVIDER_SITE_OTHER): Payer: BC Managed Care – PPO | Admitting: Family

## 2021-09-07 VITALS — BP 124/80 | HR 70 | Temp 97.9°F | Ht 68.0 in | Wt 224.0 lb

## 2021-09-07 DIAGNOSIS — Z23 Encounter for immunization: Secondary | ICD-10-CM | POA: Diagnosis not present

## 2021-09-07 DIAGNOSIS — Z0000 Encounter for general adult medical examination without abnormal findings: Secondary | ICD-10-CM | POA: Diagnosis not present

## 2021-09-07 NOTE — Assessment & Plan Note (Signed)
Discussed healthy diet, exercise and weight loss. Pap and mammo up to date. Colo scheduled.  Declines flu shot/covid vaccines.  ?

## 2021-09-07 NOTE — Patient Instructions (Signed)
Please continue your work on healthy diet, exercise.  ?Complete lab work prior to leaving.  ?

## 2021-09-07 NOTE — Progress Notes (Signed)
? ?Subjective:  ? ?By signing my name below, I, Cassell ClementAmber Collins, attest that this documentation has been prepared under the direction and in the presence of Sandford CrazeO'Sullivan, Vidyuth Belsito NP, 09/07/2021  ? ? Patient ID: Lori MustMarcia G Stone, female    DOB: 04/05/1976, 46 y.o.   MRN: [id number] ? ?Chief Complaint  ?Patient presents with  ? Annual Exam  ?  Cpe- fasting   ? ? ?HPI ?Patient is in today for a comprehensive physical exam. ? ?Menstruation - She occasionally has spotting. She also gets hot flashes. ? ?Synthroid, Levothroid - Her medication occasionally causes her constipation.  ? ?She denies having any fever, ear pain, new muscle pain, joint pain, new moles, congestion, sinus pain, sore throat, palpations, wheezing, n/v/d, blood in stool, dysuria, frequency, hematuria at this time. ? ?Social History: She has two younger sisters. Both of them have hyperthyroidism.  ?Colonoscopy: She is scheduled to have a colonoscopy exam.  ?Hepatitis C: She is not interested in a Hepatitis C screening. ?Pap Smear: Last completed 12/31/2018 ?Mammogram: Last completed 2022. ?Immunizations: She is not interested in receiving the COVID - 19 vaccines.   ?Diet: Her diet has worsen recently due to her vacation.  ?Exercise: She regularly exercises. ?Dental: She is UTD on dental exams.  ?Vision: She is UTD on vision exams.  ? ?Health Maintenance Due  ?Topic Date Due  ? TETANUS/TDAP  06/24/2016  ? COLONOSCOPY (Pts [id number] Insurance coverage will need to be confirmed)  Never done  ? COVID-19 Vaccine (2 - Pfizer series) 10/23/2019  ? ? ?Past Medical History:  ?Diagnosis Date  ? Anxiety   ? GERD (gastroesophageal reflux disease)   ? Hyperthyroidism   ? ? ?Past Surgical History:  ?Procedure Laterality Date  ? DILATION AND CURETTAGE OF UTERUS  2000  ? lasix Bilateral 2012  ? radioactive iodine ablation  10/2017  ? Dr, Sharl MaKerr  ? WISDOM TOOTH EXTRACTION  1995  ? ? ?Family History  ?Problem Relation Age of Onset  ? Ovarian cancer Mother   ?     dx age 47  ? Celiac  disease Mother   ? Hypothyroidism Sister   ? Hypothyroidism Sister   ? Breast cancer Maternal Grandmother   ?     dx in her late 7870's  ? Celiac disease Other   ?     mom's side, great aunt  ? Colon cancer Neg Hx   ? ? ?Social History  ? ?Socioeconomic History  ? Marital status: Married  ?  Spouse name: Not on file  ? Number of children: 0  ? Years of education: Not on file  ? Highest education level: Not on file  ?Occupational History  ? Occupation: Charity fundraiserchemist  ?Tobacco Use  ? Smoking status: Never  ? Smokeless tobacco: Never  ?Substance and Sexual Activity  ? Alcohol use: Yes  ?  Alcohol/week: 2.0 standard drinks  ?  Types: 2 Standard drinks or equivalent per week  ?  Comment: social  ? Drug use: No  ? Sexual activity: Yes  ?  Partners: Male  ?Other Topics Concern  ? Not on file  ?Social History Narrative  ? Lives with husband  ? No children  ? Enjoys television, sporting events  ? 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 1 cat  ? Grew up in [place], has lived all over the country and also in [place] due to job moves.  ? ?Social Determinants of Health  ? ?Financial Resource Strain: Not on  file  ?Food Insecurity: Not on file  ?Transportation Needs: Not on file  ?Physical Activity: Not on file  ?Stress: Not on file  ?Social Connections: Not on file  ?Intimate Partner Violence: Not on file  ? ? ?Outpatient Medications Prior to Visit  ?Medication Sig Dispense Refill  ? ALPRAZolam (XANAX) 0.25 MG tablet TAKE 1 TABLET BY MOUTH AS NEEDED BEFORE FLYING 30 tablet 0  ? hydrochlorothiazide (HYDRODIURIL) 25 MG tablet Take 1 tablet (25 mg total) by mouth daily. 90 tablet 1  ? levothyroxine (SYNTHROID, LEVOTHROID) 175 MCG tablet Take 1 tablet by mouth every morning.  4  ? SUMAtriptan (IMITREX) 50 MG tablet Take 1 tablet by mouth as needed for migraine, may repeat in 2 hrs if needed. 10 tablet 5  ? ?No facility-administered medications prior to visit.  ? ? ?Allergies  ?Allergen Reactions  ? Methimazole Itching and Rash  ?  Propylthiouracil Hives and Itching  ? Contrave [Naltrexone-Bupropion Hcl Er]   ?  Extreme nausea  ? ? ?Review of Systems  ?Constitutional:  Negative for fever.  ?HENT:  Negative for congestion, ear pain, sinus pain and sore throat.   ?Respiratory:  Negative for wheezing.   ?Cardiovascular:  Negative for palpitations.  ?Gastrointestinal:  Positive for constipation. Negative for blood in stool, diarrhea, nausea and vomiting.  ?Genitourinary:  Negative for dysuria, frequency and hematuria.  ?Musculoskeletal:  Negative for joint pain and myalgias.  ?Skin:   ?     (-) New Moles  ?Psychiatric/Behavioral:  Negative for depression. The patient is not nervous/anxious.   ? ?   ?Objective:  ?  ?Physical Exam ?Constitutional:   ?   General: She is not in acute distress. ?   Appearance: Normal appearance. She is not ill-appearing.  ?HENT:  ?   Head: Normocephalic and atraumatic.  ?   Right Ear: Tympanic membrane, ear canal and external ear normal.  ?   Left Ear: Tympanic membrane, ear canal and external ear normal.  ?   Mouth/Throat:  ?   Tonsils: No tonsillar exudate. 2+ on the right. 2+ on the left.  ?Eyes:  ?   Extraocular Movements: Extraocular movements intact.  ?   Pupils: Pupils are equal, round, and reactive to light.  ?Cardiovascular:  ?   Rate and Rhythm: Normal rate and regular rhythm.  ?   Heart sounds: Normal heart sounds. No murmur heard. ?  No gallop.  ?Pulmonary:  ?   Effort: Pulmonary effort is normal. No respiratory distress.  ?   Breath sounds: Normal breath sounds. No wheezing or rales.  ?Abdominal:  ?   General: Bowel sounds are normal. There is no distension.  ?   Palpations: Abdomen is soft.  ?   Tenderness: There is no abdominal tenderness. There is no guarding.  ?Lymphadenopathy:  ?   Cervical: No cervical adenopathy.  ?Skin: ?   General: Skin is warm and dry.  ?Neurological:  ?   Mental Status: She is alert and oriented to person, place, and time.  ?Psychiatric:     ?   Mood and Affect: Mood normal.      ?   Behavior: Behavior normal.     ?   Judgment: Judgment normal.  ? ? ?BP 124/80   Pulse 70   Temp 97.9 ?F (36.6 ?C) (Oral)   Ht 5\' 8"  (1.727 m)   Wt 224 lb (101.6 kg)   SpO2 97%   BMI 34.06 kg/m?  ?Wt Readings from Last 3 Encounters:  ?  09/07/21 224 lb (101.6 kg)  ?08/06/21 217 lb (98.4 kg)  ?08/07/20 235 lb 2 oz (106.7 kg)  ? ? ?   ?Assessment & Plan:  ? ?Problem List Items Addressed This Visit   ? ?  ? Unprioritized  ? Preventative health care  ?  Discussed healthy diet, exercise and weight loss. Pap and mammo up to date. Colo scheduled.  Declines flu shot/covid vaccines.  ?  ?  ? ? ? ? ?No orders of the defined types were placed in this encounter. ? ? ?I, Lemont Fillers, NP, personally preformed the services described in this documentation.  All medical record entries made by the scribe were at my direction and in my presence.  I have reviewed the chart and discharge instructions (if applicable) and agree that the record reflects my personal performance and is accurate and complete. Sandford Craze NP, 09/07/2021 ? ?I,Amber Collins,acting as a Neurosurgeon for [employer], NP.,have documented all relevant documentation on the behalf of Lemont Fillers, NP,as directed by  Lemont Fillers, NP while in the presence of Lemont Fillers, NP. ? ? ? ?Lemont Fillers, NP ? ?

## 2021-10-01 ENCOUNTER — Encounter (HOSPITAL_BASED_OUTPATIENT_CLINIC_OR_DEPARTMENT_OTHER): Payer: Self-pay | Admitting: Otolaryngology

## 2021-10-01 ENCOUNTER — Other Ambulatory Visit: Payer: Self-pay

## 2021-10-04 ENCOUNTER — Encounter (HOSPITAL_BASED_OUTPATIENT_CLINIC_OR_DEPARTMENT_OTHER)
Admission: RE | Admit: 2021-10-04 | Discharge: 2021-10-04 | Disposition: A | Discharge status: Home / Self Care | Payer: BC Managed Care – PPO | Source: Ambulatory Visit | Attending: Otolaryngology | Admitting: Otolaryngology

## 2021-10-04 DIAGNOSIS — Z01818 Encounter for other preprocedural examination: Secondary | ICD-10-CM | POA: Diagnosis not present

## 2021-10-04 DIAGNOSIS — Z79899 Other long term (current) drug therapy: Secondary | ICD-10-CM | POA: Diagnosis not present

## 2021-10-04 LAB — BASIC METABOLIC PANEL
Anion gap: 7 (ref 5–15)
BUN: 7 mg/dL (ref 6–20)
CO2: 27 mmol/L (ref 22–32)
Calcium: 9.1 mg/dL (ref 8.9–10.3)
Chloride: 103 mmol/L (ref 98–111)
Creatinine, Ser: 0.85 mg/dL (ref 0.44–1.00)
GFR, Estimated: >60 mL/min (ref >60)
Glucose, Bld: 108 mg/dL — ABNORMAL HIGH (ref 70–99)
Potassium: 4.5 mmol/L (ref 3.5–5.1)
Sodium: 137 mmol/L (ref 135–145)

## 2021-10-10 ENCOUNTER — Encounter (HOSPITAL_BASED_OUTPATIENT_CLINIC_OR_DEPARTMENT_OTHER): Payer: Self-pay | Admitting: Otolaryngology

## 2021-10-10 ENCOUNTER — Other Ambulatory Visit: Payer: Self-pay

## 2021-10-10 ENCOUNTER — Ambulatory Visit (HOSPITAL_BASED_OUTPATIENT_CLINIC_OR_DEPARTMENT_OTHER)
Admission: RE | Admit: 2021-10-10 | Discharge: 2021-10-10 | Disposition: A | Discharge status: Home / Self Care | Payer: BC Managed Care – PPO | Source: Ambulatory Visit | Attending: Otolaryngology | Admitting: Otolaryngology

## 2021-10-10 ENCOUNTER — Ambulatory Visit (HOSPITAL_BASED_OUTPATIENT_CLINIC_OR_DEPARTMENT_OTHER): Payer: BC Managed Care – PPO | Admitting: Anesthesiology

## 2021-10-10 ENCOUNTER — Encounter (HOSPITAL_BASED_OUTPATIENT_CLINIC_OR_DEPARTMENT_OTHER)
Admission: RE | Disposition: A | Discharge status: Home / Self Care | Payer: Self-pay | Source: Ambulatory Visit | Attending: Otolaryngology

## 2021-10-10 DIAGNOSIS — Z6834 Body mass index (BMI) 34.0-34.9, adult: Secondary | ICD-10-CM | POA: Insufficient documentation

## 2021-10-10 DIAGNOSIS — E0590 Thyrotoxicosis, unspecified without thyrotoxic crisis or storm: Secondary | ICD-10-CM | POA: Diagnosis not present

## 2021-10-10 DIAGNOSIS — K219 Gastro-esophageal reflux disease without esophagitis: Secondary | ICD-10-CM | POA: Insufficient documentation

## 2021-10-10 DIAGNOSIS — J0300 Acute streptococcal tonsillitis, unspecified: Secondary | ICD-10-CM | POA: Diagnosis not present

## 2021-10-10 DIAGNOSIS — J0301 Acute recurrent streptococcal tonsillitis: Secondary | ICD-10-CM | POA: Diagnosis not present

## 2021-10-10 DIAGNOSIS — I10 Essential (primary) hypertension: Secondary | ICD-10-CM | POA: Diagnosis not present

## 2021-10-10 DIAGNOSIS — Z9089 Acquired absence of other organs: Secondary | ICD-10-CM

## 2021-10-10 DIAGNOSIS — Z79899 Other long term (current) drug therapy: Secondary | ICD-10-CM

## 2021-10-10 DIAGNOSIS — E669 Obesity, unspecified: Secondary | ICD-10-CM | POA: Insufficient documentation

## 2021-10-10 HISTORY — DX: Acute recurrent streptococcal tonsillitis: J03.01

## 2021-10-10 HISTORY — PX: TONSILLECTOMY: SHX5217

## 2021-10-10 HISTORY — DX: Essential (primary) hypertension: I10

## 2021-10-10 LAB — POCT PREGNANCY, URINE: Preg Test, Ur: NEGATIVE

## 2021-10-10 SURGERY — TONSILLECTOMY
Anesthesia: General | Laterality: Bilateral

## 2021-10-10 MED ORDER — ONDANSETRON HCL 4 MG/2ML IJ SOLN: 4.0000 mg | INTRAMUSCULAR | Status: DC | PRN

## 2021-10-10 MED ORDER — PROMETHAZINE HCL 25 MG/ML IJ SOLN: 6.2500 mg | INTRAMUSCULAR | Status: DC | PRN

## 2021-10-10 MED ORDER — IBUPROFEN 100 MG/5ML PO SUSP: 400.0000 mg | Freq: Four times a day (QID) | ORAL | Status: DC | PRN

## 2021-10-10 MED ORDER — 0.9 % SODIUM CHLORIDE (POUR BTL) OPTIME
TOPICAL | Status: DC | PRN
  Administered 2021-10-10: 30 mL

## 2021-10-10 MED ORDER — OXYCODONE HCL 5 MG/5ML PO SOLN: 5.0000 mg | Freq: Once | ORAL | Status: DC | PRN

## 2021-10-10 MED ORDER — LACTATED RINGERS IV SOLN: INTRAVENOUS | Status: DC

## 2021-10-10 MED ORDER — OXYCODONE HCL 5 MG PO TABS: 5.0000 mg | ORAL_TABLET | Freq: Once | ORAL | Status: DC | PRN

## 2021-10-10 MED ORDER — HYDROMORPHONE HCL 1 MG/ML IJ SOLN
0.2500 mg | INTRAMUSCULAR | Status: DC | PRN
  Administered 2021-10-10 (×2): 0.25 mg via INTRAVENOUS

## 2021-10-10 MED ORDER — HYDROCODONE-ACETAMINOPHEN 7.5-325 MG/15ML PO SOLN: 15.0000 mL | Freq: Four times a day (QID) | ORAL | 0 refills | Status: DC | PRN

## 2021-10-10 MED ORDER — AMISULPRIDE (ANTIEMETIC) 5 MG/2ML IV SOLN
INTRAVENOUS | Status: AC
  Filled 2021-10-10: qty 4

## 2021-10-10 MED ORDER — DEXAMETHASONE SODIUM PHOSPHATE 4 MG/ML IJ SOLN
INTRAMUSCULAR | Status: DC | PRN
  Administered 2021-10-10: 10 mg via INTRAVENOUS

## 2021-10-10 MED ORDER — LIDOCAINE HCL (CARDIAC) PF 100 MG/5ML IV SOSY
PREFILLED_SYRINGE | INTRAVENOUS | Status: DC | PRN
  Administered 2021-10-10: 100 mg via INTRAVENOUS

## 2021-10-10 MED ORDER — SUGAMMADEX SODIUM 500 MG/5ML IV SOLN
INTRAVENOUS | Status: DC | PRN
  Administered 2021-10-10: 500 mg via INTRAVENOUS

## 2021-10-10 MED ORDER — PROPOFOL 10 MG/ML IV BOLUS
INTRAVENOUS | Status: DC | PRN
  Administered 2021-10-10: 20 mg via INTRAVENOUS
  Administered 2021-10-10: 150 mg via INTRAVENOUS

## 2021-10-10 MED ORDER — HYDROMORPHONE HCL 1 MG/ML IJ SOLN
INTRAMUSCULAR | Status: AC
  Filled 2021-10-10: qty 0.5

## 2021-10-10 MED ORDER — PHENOL 1.4 % MT LIQD: 1.0000 | OROMUCOSAL | Status: DC | PRN

## 2021-10-10 MED ORDER — LEVOTHYROXINE SODIUM 175 MCG PO TABS: 175.0000 ug | ORAL_TABLET | ORAL | Status: DC

## 2021-10-10 MED ORDER — MIDAZOLAM HCL 2 MG/2ML IJ SOLN
INTRAMUSCULAR | Status: AC
  Filled 2021-10-10: qty 2

## 2021-10-10 MED ORDER — HYDROCHLOROTHIAZIDE 25 MG PO TABS: 25.0000 mg | ORAL_TABLET | Freq: Every day | ORAL | Status: DC

## 2021-10-10 MED ORDER — ONDANSETRON 4 MG PO TBDP: 4.0000 mg | ORAL_TABLET | Freq: Three times a day (TID) | ORAL | 0 refills | Status: DC | PRN

## 2021-10-10 MED ORDER — SUMATRIPTAN SUCCINATE 50 MG PO TABS: 50.0000 mg | ORAL_TABLET | ORAL | Status: DC | PRN

## 2021-10-10 MED ORDER — FENTANYL CITRATE (PF) 100 MCG/2ML IJ SOLN
INTRAMUSCULAR | Status: AC
  Filled 2021-10-10: qty 2

## 2021-10-10 MED ORDER — MEPERIDINE HCL 25 MG/ML IJ SOLN: 6.2500 mg | INTRAMUSCULAR | Status: DC | PRN

## 2021-10-10 MED ORDER — ONDANSETRON HCL 4 MG PO TABS: 4.0000 mg | ORAL_TABLET | ORAL | Status: DC | PRN

## 2021-10-10 MED ORDER — MIDAZOLAM HCL 5 MG/5ML IJ SOLN
INTRAMUSCULAR | Status: DC | PRN
  Administered 2021-10-10: 2 mg via INTRAVENOUS

## 2021-10-10 MED ORDER — LIDOCAINE 2% (20 MG/ML) 5 ML SYRINGE
INTRAMUSCULAR | Status: AC
  Filled 2021-10-10: qty 5

## 2021-10-10 MED ORDER — DEXTROSE-NACL 5-0.9 % IV SOLN: INTRAVENOUS | Status: DC

## 2021-10-10 MED ORDER — FENTANYL CITRATE (PF) 100 MCG/2ML IJ SOLN
INTRAMUSCULAR | Status: DC | PRN
Start: 2021-10-10 — End: 2021-10-10
  Administered 2021-10-10 (×4): 50 ug via INTRAVENOUS

## 2021-10-10 MED ORDER — ROCURONIUM BROMIDE 100 MG/10ML IV SOLN
INTRAVENOUS | Status: DC | PRN
  Administered 2021-10-10: 100 mg via INTRAVENOUS

## 2021-10-10 MED ORDER — HYDROCODONE-ACETAMINOPHEN 7.5-325 MG/15ML PO SOLN
10.0000 mL | ORAL | Status: DC | PRN
  Filled 2021-10-10: qty 15

## 2021-10-10 MED ORDER — AMISULPRIDE (ANTIEMETIC) 5 MG/2ML IV SOLN
10.0000 mg | Freq: Once | INTRAVENOUS | Status: AC | PRN
  Administered 2021-10-10: 10 mg via INTRAVENOUS

## 2021-10-10 SURGICAL SUPPLY — 30 items
CANISTER SUCT 1200ML W/VALVE (MISCELLANEOUS) ×2 IMPLANT
CATH ROBINSON RED A/P 12FR (CATHETERS) ×2 IMPLANT
CLEANER CAUTERY TIP 5X5 PAD (MISCELLANEOUS) ×1 IMPLANT
COAGULATOR SUCT SWTCH 10FR 6 (ELECTROSURGICAL) ×2 IMPLANT
COVER BACK TABLE 60X90IN (DRAPES) ×2 IMPLANT
COVER MAYO STAND STRL (DRAPES) ×2 IMPLANT
DEFOGGER MIRROR 1QT (MISCELLANEOUS) IMPLANT
ELECT COATED BLADE 2.86 ST (ELECTRODE) ×2 IMPLANT
ELECT REM PT RETURN 9FT ADLT (ELECTROSURGICAL)
ELECT REM PT RETURN 9FT PED (ELECTROSURGICAL)
ELECTRODE REM PT RETRN 9FT PED (ELECTROSURGICAL) IMPLANT
ELECTRODE REM PT RTRN 9FT ADLT (ELECTROSURGICAL) IMPLANT
GAUZE SPONGE 4X4 12PLY STRL LF (GAUZE/BANDAGES/DRESSINGS) ×2 IMPLANT
GLOVE SURG SYN 7.5  E (GLOVE) ×1
GLOVE SURG SYN 7.5 E (GLOVE) ×1 IMPLANT
GLOVE SURG SYN 7.5 PF PI (GLOVE) ×1 IMPLANT
GOWN STRL REUS W/ TWL LRG LVL3 (GOWN DISPOSABLE) ×2 IMPLANT
GOWN STRL REUS W/TWL LRG LVL3 (GOWN DISPOSABLE) ×2
MARKER SKIN DUAL TIP RULER LAB (MISCELLANEOUS) IMPLANT
NS IRRIG 1000ML POUR BTL (IV SOLUTION) ×2 IMPLANT
PAD CLEANER CAUTERY TIP 5X5 (MISCELLANEOUS)
PENCIL FOOT CONTROL (ELECTRODE) ×2 IMPLANT
SHEET MEDIUM DRAPE 40X70 STRL (DRAPES) ×2 IMPLANT
SPONGE TONSIL 1 RF SGL (DISPOSABLE) IMPLANT
SPONGE TONSIL 1.25 RF SGL STRG (GAUZE/BANDAGES/DRESSINGS) ×1 IMPLANT
SYR BULB EAR ULCER 3OZ GRN STR (SYRINGE) ×2 IMPLANT
TOWEL GREEN STERILE FF (TOWEL DISPOSABLE) ×2 IMPLANT
TUBE CONNECTING 20X1/4 (TUBING) ×2 IMPLANT
TUBE SALEM SUMP 12R W/ARV (TUBING) IMPLANT
TUBE SALEM SUMP 16 FR W/ARV (TUBING) ×1 IMPLANT

## 2021-10-10 NOTE — Anesthesia Postprocedure Evaluation (Signed)
Anesthesia Post Note ? ?Patient: Lori Stone ? ?Procedure(s) Performed: TONSILLECTOMY (Bilateral) ? ?  ? ?Patient location during evaluation: PACU ?Anesthesia Type: General ?Level of consciousness: awake and alert ?Pain management: pain level controlled ?Vital Signs Assessment: post-procedure vital signs reviewed and stable ?Respiratory status: spontaneous breathing, nonlabored ventilation and respiratory function stable ?Cardiovascular status: blood pressure returned to baseline and stable ?Postop Assessment: no apparent nausea or vomiting ?Anesthetic complications: no ? ? ?No notable events documented. ? ?Last Vitals:  ?Vitals:  ? 10/10/21 1115 10/10/21 1130  ?BP: (!) 142/80 (!) 138/91  ?Pulse: 70 63  ?Resp: 18 12  ?Temp:    ?SpO2: 97% 97%  ?  ?Last Pain:  ?Vitals:  ? 10/10/21 1115  ?TempSrc:   ?PainSc: 3   ? ? ?  ?  ?  ?  ?  ?  ? ?Lowella Curb ? ? ? ? ?

## 2021-10-10 NOTE — Transfer of Care (Signed)
Immediate Anesthesia Transfer of Care Note ? ?Patient: Lori Stone ? ?Procedure(s) Performed: TONSILLECTOMY (Bilateral) ? ?Patient Location: PACU ? ?Anesthesia Type:General ? ?Level of Consciousness: awake, alert  and oriented ? ?Airway & Oxygen Therapy: Patient Spontanous Breathing and Patient connected to face mask oxygen ? ?Post-op Assessment: Report given to RN and Post -op Vital signs reviewed and stable ? ?Post vital signs: Reviewed and stable ? ?Last Vitals:  ?Vitals Value Taken Time  ?BP 171/103 10/10/21 1049  ?Temp    ?Pulse 81 10/10/21 1051  ?Resp 10 10/10/21 1051  ?SpO2 97 % 10/10/21 1051  ?Vitals shown include unvalidated device data. ? ?Last Pain:  ?Vitals:  ? 10/10/21 0904  ?TempSrc: Oral  ?PainSc: 0-No pain  ?   ? ?  ? ?Complications: No notable events documented. ?

## 2021-10-10 NOTE — H&P (Signed)
HPI:  ? ?Lori Stone is a 46 y.o. female who presents as a consult Patient.  ? ?Referring Provider: Peggyann Stone, Lori She* ? ?Chief complaint: Tonsillitis. ? ?HPI: History of recurrent strep throat even throughout adulthood. She typically has it 3 times each year and has had this since childhood. She has had 1 negative test recently. She is not around any children. She also has problems with chronic heartburn. She drinks 2 cans of cola daily. Otherwise in good health. ? ?PMH/Meds/All/SocHx/FamHx/ROS:  ? ?Past Medical History:  ?Diagnosis Date  ? Hyperthyroidism  ? Menometrorrhagia  ? ?Past Surgical History:  ?Procedure Laterality Date  ? DILATION AND CURETTAGE OF UTERUS  ? radioactive thyroid ablation 2019  ? ?No family history of bleeding disorders, wound healing problems or difficulty with anesthesia.  ? ?Social History  ? ?Socioeconomic History  ? Marital status: Married  ?Spouse name: Not on file  ? Number of children: Not on file  ? Years of education: Not on file  ? Highest education level: Not on file  ?Occupational History  ? Not on file  ?Tobacco Use  ? Smoking status: Never  ? Smokeless tobacco: Never  ?Vaping Use  ? Vaping Use: Never used  ?Substance and Sexual Activity  ? Alcohol use: Yes  ?Comment: occ  ? Drug use: No  ? Sexual activity: Yes  ?Partners: Male  ?Birth control/protection: I.U.D.  ?Comment: Mirena IUD  ?Other Topics Concern  ? Not on file  ?Social History Narrative  ? Not on file  ? ?Social Determinants of Health  ? ?Financial Resource Strain: Not on file  ?Food Insecurity: Not on file  ?Transportation Needs: Not on file  ?Physical Activity: Not on file  ?Stress: Not on file  ?Social Connections: Not on file  ?Housing Stability: Not on file  ? ?Current Outpatient Medications:  ? ALPRAZolam (XANAX) 0.25 MG tablet, Take 1 tablet as needed before flying, Disp: , Rfl:  ? hydroCHLOROthiazide (HYDRODIURIL) 25 MG tablet, Take 1 tablet (25 mg total) by mouth., Disp: , Rfl:  ? levonorgestreL  (MIRENA) 20 mcg/24 hours (6 yrs) 52 mg IUD, 1 each by Intrauterine route once., Disp: , Rfl:  ? levothyroxine (SYNTHROID) 137 MCG tablet, 1 TABLET IN THE MORNING ON AN EMPTY STOMACH ONCE A DAY, Disp: , Rfl:  ? SUMAtriptan (IMITREX) 50 MG tablet, Take 1 tablet by mouth as needed for migraine, may repeat in 2 hrs if needed., Disp: , Rfl:  ? ?A complete ROS was performed with pertinent positives/negatives noted in the HPI. The remainder of the ROS are negative. ? ? ?Physical Exam:  ? ?Temp 97.5 ?F (36.4 ?C)  Ht 1.676 m (5\' 6" )  Wt 98 kg (216 lb)  BMI 34.86 kg/m?  ? ?General: Healthy and alert, in no distress, breathing easily. Normal affect. In a pleasant mood. ?Head: Normocephalic, atraumatic. No masses, or scars. ?Eyes: Pupils are equal, and reactive to light. Vision is grossly intact. No spontaneous or gaze nystagmus. ?Ears: Ear canals are clear. Tympanic membranes are intact, with normal landmarks and the middle ears are clear and healthy. ?Hearing: Grossly normal. ?Nose: Nasal cavities are clear with healthy mucosa, no polyps or exudate. Airways are patent. ?Face: No masses or scars, facial nerve function is symmetric. ?Oral Cavity: No mucosal abnormalities are noted. Tongue with normal mobility. Dentition appears healthy. ?Oropharynx: Tonsils are symmetric, medium size, without any debris or exudate. There are no mucosal masses identified. Tongue base appears normal and healthy. ?Larynx/Hypopharynx: indirect exam reveals healthy, mobile  vocal cords, without mucosal lesions in the hypopharynx or larynx. There is mild edema of the arytenoid mucosa. ?Chest: Deferred ?Neck: No palpable masses, no cervical adenopathy, no thyroid nodules or enlargement. ?Neuro: Cranial nerves II-XII with normal function. ?Balance: Normal gate. ?Other findings: none. ? ?Independent Review of Additional Tests or Records:  ?none ? ?Procedures:  ?none ? ?Impression & Plans:  ?Recurrent streptococcal tonsillitis. Consider  tonsillectomy.Lori Stone meets the indications for tonsillectomy. Risks and benefits were discussed in detail. All questions were answered. A handout was provided with additional details. ? ?Chronic reflux.We discussed causes of reflux, including lifestyle and dietary factors. Recommend strict avoidance of all tobacco, caffeine, alcohol, chocolate and peppermint. A reflux handout with more detailed instructions was provided to the patient.   ? ?

## 2021-10-10 NOTE — Anesthesia Procedure Notes (Signed)
Procedure Name: Intubation ?Date/Time: 10/10/2021 10:16 AM ?Performed by: Maryella Shivers, CRNA ?Pre-anesthesia Checklist: Patient identified, Emergency Drugs available, Suction available and Patient being monitored ?Patient Re-evaluated:Patient Re-evaluated prior to induction ?Oxygen Delivery Method: Circle system utilized ?Preoxygenation: Pre-oxygenation with 100% oxygen ?Induction Type: IV induction ?Ventilation: Mask ventilation without difficulty ?Laryngoscope Size: Mac and 4 ?Grade View: Grade I ?Tube type: Oral ?Tube size: 7.0 mm ?Number of attempts: 1 ?Airway Equipment and Method: Stylet and Oral airway ?Placement Confirmation: ETT inserted through vocal cords under direct vision, positive ETCO2 and breath sounds checked- equal and bilateral ?Secured at: 22 cm ?Tube secured with: Tape ?Dental Injury: Teeth and Oropharynx as per pre-operative assessment  ? ? ? ? ?

## 2021-10-10 NOTE — Anesthesia Preprocedure Evaluation (Signed)
Anesthesia Evaluation  ?Patient identified by MRN, date of birth, ID band ?Patient awake ? ? ? ?Reviewed: ?Allergy & Precautions, NPO status , Patient's Chart, lab work & pertinent test results ? ?Airway ?Mallampati: II ? ?TM Distance: >3 FB ?Neck ROM: Full ? ? ? Dental ?no notable dental hx. ? ?  ?Pulmonary ?neg pulmonary ROS,  ?  ?Pulmonary exam normal ?breath sounds clear to auscultation ? ? ? ? ? ? Cardiovascular ?hypertension, Pt. on medications ?negative cardio ROS ?Normal cardiovascular exam ?Rhythm:Regular Rate:Normal ? ? ?  ?Neuro/Psych ? Headaches, Anxiety negative psych ROS  ? GI/Hepatic ?Neg liver ROS, GERD  ,  ?Endo/Other  ?Hyperthyroidism  ? Renal/GU ?negative Renal ROS  ?negative genitourinary ?  ?Musculoskeletal ?negative musculoskeletal ROS ?(+)  ? Abdominal ?(+) + obese,   ?Peds ?negative pediatric ROS ?(+)  Hematology ? ?(+) Blood dyscrasia, anemia ,   ?Anesthesia Other Findings ? ? Reproductive/Obstetrics ?negative OB ROS ? ?  ? ? ? ? ? ? ? ? ? ? ? ? ? ?  ?  ? ? ? ? ? ? ? ? ?Anesthesia Physical ?Anesthesia Plan ? ?ASA: 2 ? ?Anesthesia Plan: General  ? ?Post-op Pain Management:   ? ?Induction: Intravenous ? ?PONV Risk Score and Plan: 3 and Ondansetron, Dexamethasone, Midazolam and Treatment may vary due to age or medical condition ? ?Airway Management Planned: Oral ETT ? ?Additional Equipment:  ? ?Intra-op Plan:  ? ?Post-operative Plan: Extubation in OR ? ?Informed Consent: I have reviewed the patients History and Physical, chart, labs and discussed the procedure including the risks, benefits and alternatives for the proposed anesthesia with the patient or authorized representative who has indicated his/her understanding and acceptance.  ? ? ? ?Dental advisory given ? ?Plan Discussed with: CRNA ? ?Anesthesia Plan Comments:   ? ? ? ? ? ? ?Anesthesia Quick Evaluation ? ?

## 2021-10-10 NOTE — Op Note (Signed)
10/10/2021 ? ?10:36 AM ? ?PATIENT:  Lori Stone  46 y.o. female ? ?PRE-OPERATIVE DIAGNOSIS:  Recurrent streptococcal tonsillitis ? ?POST-OPERATIVE DIAGNOSIS:  Recurrent streptococcal tonsillitis ? ?PROCEDURE:  Procedure(s): ?TONSILLECTOMY ? ?SURGEON:  Surgeon(s): ?Serena Colonel, MD ? ?ANESTHESIA:   General ? ?COUNTS: Correct ? ? ?DICTATION: The patient was taken to the operating room and placed on the operating table in the supine position. Following induction of general endotracheal anesthesia, the table was turned and the patient was draped in a standard fashion. A Crowe-Davis mouthgag was inserted into the oral cavity and used to retract the tongue and mandible, then attached to the Mayo stand. ? ?The tonsillectomy was then performed using electrocautery dissection, carefully dissecting the avascular plane between the capsule and constrictor muscles. Cautery was used for completion of hemostasis. The tonsils were large and cryptic , and were discarded. ? ?The pharynx was irrigated with saline and suctioned. An oral gastric tube was used to aspirate the contents of the stomach. The patient was then awakened from anesthesia and transferred to PACU in stable condition. ? ? ?PATIENT DISPOSITION:  To PACA, stable ?  ? ? ? ?

## 2021-10-11 ENCOUNTER — Encounter (HOSPITAL_BASED_OUTPATIENT_CLINIC_OR_DEPARTMENT_OTHER): Payer: Self-pay | Admitting: Otolaryngology

## 2021-11-08 DIAGNOSIS — Z1211 Encounter for screening for malignant neoplasm of colon: Secondary | ICD-10-CM | POA: Diagnosis not present

## 2021-11-08 DIAGNOSIS — K640 First degree hemorrhoids: Secondary | ICD-10-CM | POA: Diagnosis not present

## 2021-11-08 LAB — HM COLONOSCOPY

## 2021-12-13 ENCOUNTER — Encounter: Payer: Self-pay | Admitting: Family

## 2022-01-18 ENCOUNTER — Encounter: Payer: Self-pay | Admitting: Family

## 2022-01-21 MED ORDER — HYDROCHLOROTHIAZIDE 25 MG PO TABS: 25.0000 mg | ORAL_TABLET | Freq: Every day | ORAL | 0 refills | Status: DC

## 2022-02-06 ENCOUNTER — Encounter: Payer: Self-pay | Admitting: Family

## 2022-02-06 MED ORDER — SUMATRIPTAN SUCCINATE 50 MG PO TABS
ORAL_TABLET | ORAL | 1 refills | Status: DC
Start: 2022-02-06 — End: 2022-03-11

## 2022-02-15 ENCOUNTER — Ambulatory Visit: Payer: BC Managed Care – PPO | Admitting: Family Medicine

## 2022-02-15 ENCOUNTER — Encounter: Payer: Self-pay | Admitting: Family Medicine

## 2022-02-15 VITALS — BP 140/84 | HR 77 | Temp 99.7°F | Resp 16 | Ht 68.0 in | Wt 230.6 lb

## 2022-02-15 DIAGNOSIS — H6983 Other specified disorders of Eustachian tube, bilateral: Secondary | ICD-10-CM

## 2022-02-15 DIAGNOSIS — J029 Acute pharyngitis, unspecified: Secondary | ICD-10-CM | POA: Diagnosis not present

## 2022-02-15 MED ORDER — PREDNISONE 20 MG PO TABS: 40.0000 mg | ORAL_TABLET | Freq: Every day | ORAL | 0 refills | Status: AC

## 2022-02-15 NOTE — Patient Instructions (Signed)
OK to take Tylenol 1000 mg (2 extra strength tabs) or 975 mg (3 regular strength tabs) every 6 hours as needed.  Don't take the prescription if you continue to improve.  Consider throat lozenges, salt water gargles and an air humidifier for symptomatic care.   Let us know if you need anything.

## 2022-02-15 NOTE — Progress Notes (Signed)
Chief Complaint  Patient presents with   Sore Throat    Here for sore throat     Lori Stone here for URI complaints.  Duration: 4 days  Associated symptoms: Fever (99 F), rhinorrhea, ear pain, sore throat, and coughing Denies: sinus congestion, sinus pain, itchy watery eyes, ear drainage, wheezing, shortness of breath, myalgia, and N/V Treatment to date: Tylenol, Nyquil, Dayquil Sick contacts: No  Past Medical History:  Diagnosis Date   Anxiety    GERD (gastroesophageal reflux disease)    Hypertension    Hyperthyroidism    Recurrent streptococcal tonsillitis     Objective BP (!) 140/84 (BP Location: Right Arm, Patient Position: Sitting, Cuff Size: Normal)   Pulse 77   Temp 99.7 F (37.6 C) (Oral)   Resp 16   Ht 5\' 8"  (1.727 m)   Wt 230 lb 9.6 oz (104.6 kg)   SpO2 99%   BMI 35.06 kg/m  General: Awake, alert, appears stated age HEENT: AT, [place], ears patent b/l and TM's neg, nares patent w/o discharge, pharynx pink and without exudates, scar tissue from tonsillectomy noted b/l, MMM Neck: No masses or asymmetry Heart: RRR Lungs: CTAB, no accessory muscle use Psych: Age appropriate judgment and insight, normal mood and affect  Sore throat - Plan: predniSONE (DELTASONE) 20 MG tablet  Dysfunction of both eustachian tubes - Plan: predniSONE (DELTASONE) 20 MG tablet  5 d pred burst 40 mg/d to cover for ETD. If no improvement, will take prednisone. Tylenol, throat lozenges, air humidifier.  Continue to push fluids, practice good hand hygiene, cover mouth when coughing. F/u prn. If starting to experience fevers, shaking, or shortness of breath, seek immediate care. Pt voiced understanding and agreement to the plan.  [place], DO 02/15/22 2:51 PM

## 2022-02-18 ENCOUNTER — Other Ambulatory Visit: Payer: Self-pay | Admitting: Family

## 2022-02-19 MED ORDER — ALPRAZOLAM 0.25 MG PO TABS: ORAL_TABLET | ORAL | 0 refills | Status: DC

## 2022-02-19 NOTE — Telephone Encounter (Signed)
Requesting: alprazolam 0.25mg   Contract: 07/23/19 UDS: 08/06/21 Last Visit: 09/07/21  Next Visit: 03/11/22 Last Refill: 08/06/21 #30 and 0RF  Please Advise

## 2022-02-19 NOTE — Telephone Encounter (Signed)
Reviewed PDMP aware. Note was made of 480 hydrocodone dispensed in April following tonsillectomy but it states it was an 8 day supply.  I think that the 480 was a typo.  Refill provided for xanax.

## 2022-02-26 ENCOUNTER — Encounter: Payer: Self-pay | Admitting: Family

## 2022-02-26 MED ORDER — WEGOVY 0.25 MG/0.5ML ~~LOC~~ SOAJ
0.2500 mg | SUBCUTANEOUS | 0 refills | Status: DC
Start: 2022-02-26 — End: 2022-03-11

## 2022-02-26 NOTE — Addendum Note (Signed)
Addended by: Sandford Craze on: 02/26/2022 02:58 PM   Modules accepted: Orders

## 2022-02-27 ENCOUNTER — Telehealth: Payer: Self-pay

## 2022-02-27 NOTE — Telephone Encounter (Signed)
PA initiated via Covermymeds; KEY: [id number]. Awaiting determination.

## 2022-02-28 NOTE — Telephone Encounter (Signed)
PA approved.    Effective from 02/27/2022 through 07/02/2022. Please note: This medication is to be titrated up in strength every 4 weeks as tolerated by the member. The quantity limit set of 4 pens per 180 days allows for this titration through all strengths using 4 pens of each strength every 28 days.

## 2022-03-11 ENCOUNTER — Encounter: Payer: Self-pay | Admitting: Family

## 2022-03-11 ENCOUNTER — Ambulatory Visit: Payer: BC Managed Care – PPO | Admitting: Family

## 2022-03-11 ENCOUNTER — Other Ambulatory Visit (HOSPITAL_BASED_OUTPATIENT_CLINIC_OR_DEPARTMENT_OTHER): Payer: Self-pay

## 2022-03-11 VITALS — BP 131/81 | HR 62 | Temp 98.0°F | Resp 16 | Wt 233.0 lb

## 2022-03-11 DIAGNOSIS — F40243 Fear of flying: Secondary | ICD-10-CM | POA: Diagnosis not present

## 2022-03-11 DIAGNOSIS — E039 Hypothyroidism, unspecified: Secondary | ICD-10-CM

## 2022-03-11 DIAGNOSIS — I10 Essential (primary) hypertension: Secondary | ICD-10-CM | POA: Diagnosis not present

## 2022-03-11 DIAGNOSIS — G43909 Migraine, unspecified, not intractable, without status migrainosus: Secondary | ICD-10-CM

## 2022-03-11 DIAGNOSIS — E669 Obesity, unspecified: Secondary | ICD-10-CM

## 2022-03-11 LAB — BASIC METABOLIC PANEL
BUN: 16 mg/dL (ref 6–23)
CO2: 29 mEq/L (ref 19–32)
Calcium: 9.1 mg/dL (ref 8.4–10.5)
Chloride: 103 mEq/L (ref 96–112)
Creatinine, Ser: 0.96 mg/dL (ref 0.40–1.20)
GFR: 70.36 mL/min (ref >60.00)
Glucose, Bld: 94 mg/dL (ref 70–99)
Potassium: 3.7 mEq/L (ref 3.5–5.1)
Sodium: 141 mEq/L (ref 135–145)

## 2022-03-11 LAB — TSH: TSH: 0.77 u[IU]/mL (ref 0.35–5.50)

## 2022-03-11 MED ORDER — HYDROCHLOROTHIAZIDE 25 MG PO TABS
25.0000 mg | ORAL_TABLET | Freq: Every day | ORAL | 0 refills | Status: DC
Start: 2022-03-11 — End: 2022-07-26

## 2022-03-11 MED ORDER — WEGOVY 0.25 MG/0.5ML ~~LOC~~ SOAJ
0.2500 mg | SUBCUTANEOUS | 0 refills | Status: DC
  Filled 2022-03-11: qty 2, 28d supply, fill #0

## 2022-03-11 MED ORDER — SUMATRIPTAN SUCCINATE 50 MG PO TABS: ORAL_TABLET | ORAL | 1 refills | Status: DC

## 2022-03-11 NOTE — Progress Notes (Signed)
Subjective:   By signing my name below, I, Carylon Perches, attest that this documentation has been prepared under the direction and in the presence of Karie Chimera, NP 03/11/2022    Patient ID: Lori Stone, female    DOB: Oct 25, 1975, 46 y.o.   MRN: [id number]  Chief Complaint  Patient presents with   Hypertension    Here for follow up    Hypertension   Patient is in today for an office visit  Wegovy: Her Wegovy medication was improved but due to backorder she was not able to receive the medication. She states that the medication will not be available at her local pharmacy until 04/2022.  Blood Pressure: As of today's visit, her blood pressure is normal. She is taking 25 Mg of Hydrochlorothiazide BP Readings from Last 3 Encounters:  03/11/22 131/81  02/15/22 (!) 140/84  10/10/21 (!) 138/91   Pulse Readings from Last 3 Encounters:  03/11/22 62  02/15/22 77  10/10/21 65   Thyroid: She is no longer seeing an endocrinologist for her thyroid symptoms. She is currently taking 175 Mcg of Synthroid.  Lab Results  Component Value Date   TSH <0.01 (L) 09/18/2016   Xanax: She takes 0.25 Mg of Xanax when she is flying.  Immunizations: She is not interested in receiving an influenza vaccine during today's visit.  Migraines: She states that her last Migraine was at the end of August and the previous migraine was in May. Her symptoms are more sporadic. She is currently taking 50 Mg of Imitrex   Health Maintenance Due  Topic Date Due   COVID-19 Vaccine (2 - Pfizer risk series) 10/23/2019    Past Medical History:  Diagnosis Date   Anxiety    GERD (gastroesophageal reflux disease)    Hypertension    Hyperthyroidism    Recurrent streptococcal tonsillitis     Past Surgical History:  Procedure Laterality Date   DILATION AND CURETTAGE OF UTERUS  2000   lasix Bilateral 2012   radioactive iodine ablation  10/2017   Dr, Buddy Duty   TONSILLECTOMY Bilateral 10/10/2021    Procedure: TONSILLECTOMY;  Surgeon: Izora Gala, MD;  Location: [facility];  Service: ENT;  Laterality: Bilateral;   [place]    Family History  Problem Relation Age of Onset   Ovarian cancer Mother        dx age 59   Celiac disease Mother    Hypothyroidism Sister    Hypothyroidism Sister    Breast cancer Maternal Grandmother        dx in her late 32's   Celiac disease Other        mom's side, great aunt   Colon cancer Neg Hx     Social History   Socioeconomic History   Marital status: Married    Spouse name: Not on file   Number of children: 0   Years of education: Not on file   Highest education level: Not on file  Occupational History   Occupation: English as a second language teacher  Tobacco Use   Smoking status: Never   Smokeless tobacco: Never  Vaping Use   Vaping Use: Never used  Substance and Sexual Activity   Alcohol use: Yes    Alcohol/week: 2.0 standard drinks of alcohol    Types: 2 Standard drinks or equivalent per week    Comment: social   Drug use: No   Sexual activity: Yes    Partners: Male  Other Topics Concern  Not on file  Social History Narrative   Lives with husband   No children   Enjoys television, sporting events   Works as English as a second language teacher for [employer]   1 cat   Grew up in [place], has lived all over the country and also in [place] due to job moves.   Social Determinants of Health   Financial Resource Strain: Not on file  Food Insecurity: Not on file  Transportation Needs: Not on file  Physical Activity: Not on file  Stress: Not on file  Social Connections: Not on file  Intimate Partner Violence: Not on file    Outpatient Medications Prior to Visit  Medication Sig Dispense Refill   ALPRAZolam (XANAX) 0.25 MG tablet TAKE 1 TABLET BY MOUTH AS NEEDED BEFORE FLYING 30 tablet 0   HYDROcodone-acetaminophen (HYCET) 7.5-325 mg/15 ml solution Take 15 mLs by mouth 4 (four) times daily as needed for moderate pain. 480 mL 0    levothyroxine (SYNTHROID, LEVOTHROID) 175 MCG tablet Take 1 tablet by mouth every morning.  4   hydrochlorothiazide (HYDRODIURIL) 25 MG tablet Take 1 tablet (25 mg total) by mouth daily. 90 tablet 0   ondansetron (ZOFRAN-ODT) 4 MG disintegrating tablet Take 1 tablet (4 mg total) by mouth every 8 (eight) hours as needed for nausea or vomiting. 20 tablet 0   Semaglutide-Weight Management (WEGOVY) 0.25 MG/0.5ML SOAJ Inject 0.25 mg into the skin once a week. 2 mL 0   SUMAtriptan (IMITREX) 50 MG tablet Take 1 tablet by mouth as needed for migraine, may repeat in 2 hrs if needed. 10 tablet 1   No facility-administered medications prior to visit.    Allergies  Allergen Reactions   Methimazole Itching and Rash   Propylthiouracil Hives and Itching   Contrave [Naltrexone-Bupropion Hcl Er]     Extreme nausea    ROS See HPI    Objective:    Physical Exam Constitutional:      General: She is not in acute distress.    Appearance: Normal appearance. She is not ill-appearing.  HENT:     Head: Normocephalic and atraumatic.     Right Ear: External ear normal.     Left Ear: External ear normal.  Eyes:     Extraocular Movements: Extraocular movements intact.     Pupils: Pupils are equal, round, and reactive to light.  Neck:     Thyroid: No thyromegaly.  Cardiovascular:     Rate and Rhythm: Normal rate and regular rhythm.     Heart sounds: Normal heart sounds. No murmur heard.    No gallop.  Pulmonary:     Effort: Pulmonary effort is normal. No respiratory distress.     Breath sounds: Normal breath sounds. No wheezing or rales.  Lymphadenopathy:     Cervical: No cervical adenopathy.  Skin:    General: Skin is warm and dry.  Neurological:     Mental Status: She is alert and oriented to person, place, and time.  Psychiatric:        Mood and Affect: Mood normal.        Behavior: Behavior normal.        Judgment: Judgment normal.     BP 131/81 (BP Location: Right Arm, Patient  Position: Sitting, Cuff Size: Large)   Pulse 62   Temp 98 F (36.7 C) (Oral)   Resp 16   Wt 233 lb (105.7 kg)   SpO2 100%   BMI 35.43 kg/m  Wt Readings from Last 3  Encounters:  03/11/22 233 lb (105.7 kg)  02/15/22 230 lb 9.6 oz (104.6 kg)  10/10/21 223 lb 15.8 oz (101.6 kg)       Assessment & Plan:   Problem List Items Addressed This Visit       Unprioritized   Obesity (BMI 30-39.9)    Wt Readings from Last 3 Encounters:  03/11/22 233 lb (105.7 kg)  02/15/22 230 lb 9.6 oz (104.6 kg)  10/10/21 223 lb 15.8 oz (101.6 kg)  She has not been able to get [facility] yet as it is on back order. Will send to our pharmacy downstairs to see if they may be able to get it sooner than [facility].      Relevant Medications   Semaglutide-Weight Management (WEGOVY) 0.25 MG/0.5ML SOAJ   Migraine without status migrainosus, not intractable    Less frequent. Continue imitrex prn.       Relevant Medications   SUMAtriptan (IMITREX) 50 MG tablet   hydrochlorothiazide (HYDRODIURIL) 25 MG tablet   Hypothyroidism - Primary    States that endo signed off and they would like pcp to manage synthroid moving forward. Check TSH, continue synthroid- adjust as needed.       Relevant Orders   TSH   Fear of flying    Uses prn xanax just for flying.       Essential hypertension    BP Readings from Last 3 Encounters:  03/11/22 131/81  02/15/22 (!) 140/84  10/10/21 (!) 138/91  BP at goal. Continue hctz once daily.       Relevant Medications   hydrochlorothiazide (HYDRODIURIL) 25 MG tablet   Other Relevant Orders   Basic metabolic panel   Meds ordered this encounter  Medications   Semaglutide-Weight Management (WEGOVY) 0.25 MG/0.5ML SOAJ    Sig: Inject 0.25 mg into the skin once a week.    Dispense:  2 mL    Refill:  0    Order Specific Question:   Supervising Provider    Answer:   Penni Homans A [[id number]]   SUMAtriptan (IMITREX) 50 MG tablet    Sig: Take 1 tablet by mouth as needed for  migraine, may repeat in 2 hrs if needed.    Dispense:  10 tablet    Refill:  1    Order Specific Question:   Supervising Provider    Answer:   Penni Homans A [[id number]]   hydrochlorothiazide (HYDRODIURIL) 25 MG tablet    Sig: Take 1 tablet (25 mg total) by mouth daily.    Dispense:  90 tablet    Refill:  0    Order Specific Question:   Supervising Provider    Answer:   Penni Homans A [[id number]]    I, Nance Pear, NP, personally preformed the services described in this documentation.  All medical record entries made by the scribe were at my direction and in my presence.  I have reviewed the chart and discharge instructions (if applicable) and agree that the record reflects my personal performance and is accurate and complete. 03/11/2022   I,Amber Collins,acting as a Education administrator for Nance Pear, NP.,have documented all relevant documentation on the behalf of Nance Pear, NP,as directed by  Nance Pear, NP while in the presence of Nance Pear, NP.    Nance Pear, NP

## 2022-03-11 NOTE — Assessment & Plan Note (Signed)
Wt Readings from Last 3 Encounters:  03/11/22 233 lb (105.7 kg)  02/15/22 230 lb 9.6 oz (104.6 kg)  10/10/21 223 lb 15.8 oz (101.6 kg)   She has not been able to get [facility] yet as it is on back order. Will send to our pharmacy downstairs to see if they may be able to get it sooner than [facility].

## 2022-03-11 NOTE — Assessment & Plan Note (Addendum)
BP Readings from Last 3 Encounters:  03/11/22 131/81  02/15/22 (!) 140/84  10/10/21 (!) 138/91   BP at goal. Continue hctz once daily.

## 2022-03-11 NOTE — Assessment & Plan Note (Signed)
Less frequent. Continue imitrex prn.

## 2022-03-11 NOTE — Assessment & Plan Note (Signed)
Uses prn xanax just for flying.

## 2022-03-11 NOTE — Assessment & Plan Note (Signed)
States that endo signed off and they would like pcp to manage synthroid moving forward. Check TSH, continue synthroid- adjust as needed.

## 2022-03-13 ENCOUNTER — Other Ambulatory Visit (HOSPITAL_BASED_OUTPATIENT_CLINIC_OR_DEPARTMENT_OTHER): Payer: Self-pay

## 2022-03-13 ENCOUNTER — Encounter (HOSPITAL_BASED_OUTPATIENT_CLINIC_OR_DEPARTMENT_OTHER): Payer: Self-pay

## 2022-03-14 ENCOUNTER — Ambulatory Visit: Payer: BC Managed Care – PPO

## 2022-03-14 DIAGNOSIS — E669 Obesity, unspecified: Secondary | ICD-10-CM

## 2022-03-14 NOTE — Progress Notes (Signed)
Patient comes in for [facility] administration teach.  She was  verbaly instructed on how to have everything she needs on hand prior to administration and how to administer medication. This was also demonstrated with a sample pen.  She was confident enough to successfully administer medication to herself.  She did not have any further questions.  She will continue to administer once a week at home as prescribed.  Rod Holler CMA

## 2022-04-01 NOTE — Addendum Note (Signed)
Addended by: Debbrah Alar on: 04/01/2022 12:33 PM   Modules accepted: Orders

## 2022-04-02 ENCOUNTER — Other Ambulatory Visit (HOSPITAL_BASED_OUTPATIENT_CLINIC_OR_DEPARTMENT_OTHER): Payer: Self-pay

## 2022-04-02 MED ORDER — WEGOVY 0.5 MG/0.5ML ~~LOC~~ SOAJ: 0.5000 mg | SUBCUTANEOUS | 1 refills | Status: DC

## 2022-04-02 MED ORDER — WEGOVY 0.5 MG/0.5ML ~~LOC~~ SOAJ
0.5000 mg | SUBCUTANEOUS | 0 refills | Status: DC
Start: 2022-04-02 — End: 2022-05-02
  Filled 2022-04-02: qty 2, 28d supply, fill #0

## 2022-04-02 NOTE — Addendum Note (Signed)
Addended by: Damita Dunnings D on: 04/02/2022 12:35 PM   Modules accepted: Orders

## 2022-04-02 NOTE — Addendum Note (Signed)
Addended by: Debbrah Alar on: 04/02/2022 07:57 AM   Modules accepted: Orders

## 2022-04-03 ENCOUNTER — Other Ambulatory Visit (HOSPITAL_BASED_OUTPATIENT_CLINIC_OR_DEPARTMENT_OTHER): Payer: Self-pay

## 2022-04-03 ENCOUNTER — Encounter: Payer: Self-pay | Admitting: Family

## 2022-04-08 ENCOUNTER — Other Ambulatory Visit (HOSPITAL_BASED_OUTPATIENT_CLINIC_OR_DEPARTMENT_OTHER): Payer: Self-pay

## 2022-04-09 ENCOUNTER — Other Ambulatory Visit (HOSPITAL_BASED_OUTPATIENT_CLINIC_OR_DEPARTMENT_OTHER): Payer: Self-pay

## 2022-04-11 ENCOUNTER — Other Ambulatory Visit (HOSPITAL_BASED_OUTPATIENT_CLINIC_OR_DEPARTMENT_OTHER): Payer: Self-pay

## 2022-04-11 DIAGNOSIS — R928 Other abnormal and inconclusive findings on diagnostic imaging of breast: Secondary | ICD-10-CM | POA: Diagnosis not present

## 2022-04-11 DIAGNOSIS — R92313 Mammographic fatty tissue density, bilateral breasts: Secondary | ICD-10-CM | POA: Diagnosis not present

## 2022-04-12 ENCOUNTER — Telehealth: Payer: Self-pay

## 2022-04-12 NOTE — Telephone Encounter (Signed)
Medication approval letter received 04/09/22 Reference # [id number] Effective dates 02/27/22 to 07/02/2022 [facility] 0.25mg / 0.5 mg Titrated up in strength every 4 weeks as tolerated by member.

## 2022-05-02 ENCOUNTER — Other Ambulatory Visit (HOSPITAL_BASED_OUTPATIENT_CLINIC_OR_DEPARTMENT_OTHER): Payer: Self-pay

## 2022-05-02 MED ORDER — WEGOVY 1 MG/0.5ML ~~LOC~~ SOAJ
1.0000 mg | SUBCUTANEOUS | 0 refills | Status: DC
  Filled 2022-05-02: qty 2, 28d supply, fill #0

## 2022-05-02 NOTE — Addendum Note (Signed)
Addended by: Sandford Craze on: 05/02/2022 11:55 AM   Modules accepted: Orders

## 2022-05-03 ENCOUNTER — Other Ambulatory Visit: Payer: Self-pay

## 2022-05-30 ENCOUNTER — Other Ambulatory Visit (HOSPITAL_BASED_OUTPATIENT_CLINIC_OR_DEPARTMENT_OTHER): Payer: Self-pay

## 2022-05-30 MED ORDER — WEGOVY 1.7 MG/0.75ML ~~LOC~~ SOAJ
1.7000 mg | SUBCUTANEOUS | 0 refills | Status: DC
  Filled 2022-05-30: qty 3, 28d supply, fill #0

## 2022-05-30 NOTE — Addendum Note (Signed)
[facility] on: 05/30/2022 09:02 AM   Modules accepted: Orders

## 2022-05-31 ENCOUNTER — Other Ambulatory Visit: Payer: Self-pay

## 2022-05-31 ENCOUNTER — Telehealth: Payer: Self-pay

## 2022-05-31 ENCOUNTER — Other Ambulatory Visit (HOSPITAL_BASED_OUTPATIENT_CLINIC_OR_DEPARTMENT_OTHER): Payer: Self-pay

## 2022-05-31 NOTE — Telephone Encounter (Signed)
PA received from Covermymeds (looks like it may have been previously approved- effective 02/27/22 to 07/02/22)- initiated new PA; KEY: [id number].  PA denied, per insurance determination letter, this medication is now a plan exclusion and not covered under benefits.

## 2022-06-27 ENCOUNTER — Other Ambulatory Visit (HOSPITAL_BASED_OUTPATIENT_CLINIC_OR_DEPARTMENT_OTHER): Payer: Self-pay

## 2022-06-27 MED ORDER — WEGOVY 2.4 MG/0.75ML ~~LOC~~ SOAJ
2.4000 mg | SUBCUTANEOUS | 3 refills | Status: DC
  Filled 2022-06-27: qty 3, 28d supply, fill #0
  Filled 2022-07-26: qty 3, 28d supply, fill #1
  Filled 2022-08-23: qty 3, 28d supply, fill #2
  Filled 2022-09-20 – 2022-09-24 (×2): qty 3, 28d supply, fill #3

## 2022-06-27 NOTE — Addendum Note (Signed)
Addended by: Damita Dunnings D on: 06/27/2022 01:00 PM   Modules accepted: Orders

## 2022-07-01 ENCOUNTER — Other Ambulatory Visit (HOSPITAL_BASED_OUTPATIENT_CLINIC_OR_DEPARTMENT_OTHER): Payer: Self-pay

## 2022-07-02 ENCOUNTER — Other Ambulatory Visit (HOSPITAL_BASED_OUTPATIENT_CLINIC_OR_DEPARTMENT_OTHER): Payer: Self-pay

## 2022-07-03 ENCOUNTER — Other Ambulatory Visit (HOSPITAL_BASED_OUTPATIENT_CLINIC_OR_DEPARTMENT_OTHER): Payer: Self-pay

## 2022-07-26 ENCOUNTER — Other Ambulatory Visit: Payer: Self-pay | Admitting: Family

## 2022-07-29 ENCOUNTER — Telehealth: Payer: Self-pay

## 2022-07-29 ENCOUNTER — Other Ambulatory Visit (HOSPITAL_BASED_OUTPATIENT_CLINIC_OR_DEPARTMENT_OTHER): Payer: Self-pay

## 2022-07-29 NOTE — Telephone Encounter (Signed)
PA denied.   The requested medication and/or diagnosis are not a covered benefit and excluded from coverage in accordance with the terms and conditions of your plan benefit. Therefore, the request has been administratively denied

## 2022-07-29 NOTE — Telephone Encounter (Signed)
PA initiated via Covermymeds; KEY:  [place]. Awaiting determination.

## 2022-08-23 ENCOUNTER — Other Ambulatory Visit (HOSPITAL_BASED_OUTPATIENT_CLINIC_OR_DEPARTMENT_OTHER): Payer: Self-pay

## 2022-08-27 ENCOUNTER — Other Ambulatory Visit (HOSPITAL_BASED_OUTPATIENT_CLINIC_OR_DEPARTMENT_OTHER): Payer: Self-pay

## 2022-09-05 ENCOUNTER — Other Ambulatory Visit: Payer: Self-pay | Admitting: Family

## 2022-09-05 MED ORDER — LEVOTHYROXINE SODIUM 175 MCG PO TABS: 175.0000 ug | ORAL_TABLET | Freq: Every day | ORAL | 0 refills | Status: DC

## 2022-09-09 ENCOUNTER — Encounter: Payer: BC Managed Care – PPO | Admitting: Family

## 2022-09-16 ENCOUNTER — Encounter: Payer: Self-pay | Admitting: Family

## 2022-09-16 MED ORDER — LEVOTHYROXINE SODIUM 137 MCG PO TABS: 137.0000 ug | ORAL_TABLET | Freq: Every day | ORAL | 0 refills | Status: DC

## 2022-09-20 ENCOUNTER — Other Ambulatory Visit (HOSPITAL_BASED_OUTPATIENT_CLINIC_OR_DEPARTMENT_OTHER): Payer: Self-pay

## 2022-09-23 ENCOUNTER — Telehealth: Payer: Self-pay

## 2022-09-23 ENCOUNTER — Other Ambulatory Visit (HOSPITAL_COMMUNITY): Payer: Self-pay

## 2022-09-23 NOTE — Telephone Encounter (Signed)
Pharmacy Patient Advocate Encounter   Received notification that prior authorization for [facility] 2.4MG /0.75ML auto-injectors is required/requested.  Per Test Claim: Plan exclusion   PA submitted on 09/23/22 to (ins) [employer] via CoverMyMeds Key or (Medicaid) confirmation # [id number] Status is pending

## 2022-09-23 NOTE — Telephone Encounter (Signed)
Pharmacy Patient Advocate Encounter  Received notification from [employer] that the request for prior authorization for [facility] 2.4MG /0.75ML auto-injectors has been denied due to Plan exclusion.      Please be advised we currently do not have a Pharmacist to review denials, therefore you will need to process appeals accordingly as needed. Thanks for your support at this time.   You may call Toll Free number on back of insurance card or fax [phone], to appeal.

## 2022-09-24 ENCOUNTER — Other Ambulatory Visit: Payer: Self-pay

## 2022-09-25 NOTE — Telephone Encounter (Signed)
Mychart message sent to patient.

## 2022-10-04 ENCOUNTER — Ambulatory Visit: Payer: 59 | Admitting: Family

## 2022-10-07 ENCOUNTER — Other Ambulatory Visit: Payer: Self-pay | Admitting: Family

## 2022-10-07 ENCOUNTER — Ambulatory Visit: Payer: 59 | Admitting: Family

## 2022-10-15 ENCOUNTER — Ambulatory Visit: Payer: 59 | Admitting: Family

## 2022-10-18 ENCOUNTER — Other Ambulatory Visit: Payer: Self-pay | Admitting: Family

## 2022-10-18 ENCOUNTER — Other Ambulatory Visit (HOSPITAL_BASED_OUTPATIENT_CLINIC_OR_DEPARTMENT_OTHER): Payer: Self-pay

## 2022-10-18 MED ORDER — WEGOVY 2.4 MG/0.75ML ~~LOC~~ SOAJ
2.4000 mg | SUBCUTANEOUS | 0 refills | Status: DC
Start: 2022-10-18 — End: 2022-11-01
  Filled 2022-10-18: qty 3, 28d supply, fill #0

## 2022-10-26 ENCOUNTER — Other Ambulatory Visit: Payer: Self-pay | Admitting: Family

## 2022-10-31 NOTE — Progress Notes (Signed)
Subjective:   By signing my name below, I, Lori Stone, attest that this documentation has been prepared under the direction and in the presence of Lemont Fillers, NP 11/01/22   Patient ID: Lori Stone, female    DOB: 01/14/1976, 47 y.o.   MRN: [id number]  Chief Complaint  Patient presents with   Annual Exam         HPI Patient is in today for a comprehensive physical exam.   Weight management: She is currently compliant with 2.4 mg Wegovy, which she is tolerating well. She is self-paying and is receptive to switching to [facility]. She has been staying active regularly.  Wt Readings from Last 3 Encounters:  11/01/22 211 lb (95.7 kg)  03/11/22 233 lb (105.7 kg)  02/15/22 230 lb 9.6 oz (104.6 kg)   Acute: She denies having any fever, new muscle pain, new joint pain, new moles, congestion, sinus pain, sore throat, chest pain, palpitations, cough, SOB, wheezing, n/v/d, blood in stool, dysuria, frequency, hematuria, or headaches at this time.   Immunizations: She is UTD on all immunizations.   Diet/Exercise: She is exercising about 5 days a week. She walks on the treadmill at an 11 incline, 3.5 speed for about 30 minutes.  Last colonoscopy: 11/08/2021. Small internal (grade 1) hemorrhoids. Otherwise normal.   Last mammogram: 04/02/2021.   Last pap:  Done at [facility] in 2022. She reports results were normal.   Vision/Dental: She is UTD on routine vision care and is scheduled for routine dental care next month.   Past Medical History:  Diagnosis Date   Anxiety    GERD (gastroesophageal reflux disease)    Hypertension    Hyperthyroidism    Recurrent streptococcal tonsillitis     Past Surgical History:  Procedure Laterality Date   DILATION AND CURETTAGE OF UTERUS  2000   lasix Bilateral 2012   radioactive iodine ablation  10/2017   Dr, Sharl Ma   TONSILLECTOMY Bilateral 10/10/2021   Procedure: TONSILLECTOMY;  Surgeon: Serena Colonel, MD;  Location: [facility];  Service: ENT;  Laterality: Bilateral;   WISDOM TOOTH EXTRACTION  1995    Family History  Problem Relation Age of Onset   Ovarian cancer Mother        dx age 29   Celiac disease Mother    Hypothyroidism Sister    Hypothyroidism Sister    Breast cancer Maternal Grandmother        dx in her late 15's   Celiac disease Other        mom's side, great aunt   Colon cancer Neg Hx     Social History   Socioeconomic History   Marital status: Married    Spouse name: Not on file   Number of children: 0   Years of education: Not on file   Highest education level: Not on file  Occupational History   Occupation: Charity fundraiser  Tobacco Use   Smoking status: Never   Smokeless tobacco: Never  Vaping Use   Vaping Use: Never used  Substance and Sexual Activity   Alcohol use: Yes    Alcohol/week: 2.0 standard drinks of alcohol    Types: 2 Standard drinks or equivalent per week    Comment: social   Drug use: No   Sexual activity: Yes    Partners: Male    Birth control/protection: I.U.D.    Comment: mirena  Other Topics Concern   Not on file  Social History Narrative   Lives  with husband   No childrenEnjoys television, sporting events   Works as Charity fundraiser for [employer]   1 dog   Grew up in [place], has lived all over the country and also in [place] due to job moves.   Social Determinants of Health   Financial Resource Strain: Not on file  Food Insecurity: Not on file  Transportation Needs: Not on file  Physical Activity: Not on file  Stress: Not on file  Social Connections: Not on file  Intimate Partner Violence: Not on file    Outpatient Medications Prior to Visit  Medication Sig Dispense Refill   ALPRAZolam (XANAX) 0.25 MG tablet TAKE 1 TABLET BY MOUTH AS NEEDED BEFORE FLYING 30 tablet 0   hydrochlorothiazide (HYDRODIURIL) 25 MG tablet TAKE 1 TABLET BY MOUTH DAILY 90 tablet 0   levothyroxine (SYNTHROID) 137 MCG tablet Take 1 tablet (137 mcg total) by  mouth daily before breakfast. 90 tablet 0   SUMAtriptan (IMITREX) 50 MG tablet Take 1 tablet by mouth as needed for migraine, may repeat in 2 hrs if needed. 10 tablet 1   Semaglutide-Weight Management (WEGOVY) 2.4 MG/0.75ML SOAJ Inject 2.4 mg into the skin once a week. 3 mL 0   HYDROcodone-acetaminophen (HYCET) 7.5-325 mg/15 ml solution Take 15 mLs by mouth 4 (four) times daily as needed for moderate pain. 480 mL 0   No facility-administered medications prior to visit.    Allergies  Allergen Reactions   Methimazole Itching and Rash   Propylthiouracil Hives and Itching   Contrave [Naltrexone-Bupropion Hcl Er]     Extreme nausea    ROS See HPI    Objective:    Physical Exam Constitutional:      General: She is not in acute distress.    Appearance: Normal appearance. She is not ill-appearing.  HENT:     Head: Normocephalic and atraumatic.     Right Ear: Tympanic membrane, ear canal and external ear normal.     Left Ear: Tympanic membrane, ear canal and external ear normal.  Eyes:     Extraocular Movements: Extraocular movements intact.     Right eye: No nystagmus.     Left eye: No nystagmus.     Pupils: Pupils are equal, round, and reactive to light.  Neck:     Thyroid: No thyroid tenderness.  Cardiovascular:     Rate and Rhythm: Normal rate and regular rhythm.     Heart sounds: Normal heart sounds. No murmur heard.    No gallop.  Pulmonary:     Effort: Pulmonary effort is normal. No respiratory distress.     Breath sounds: No wheezing or rales.  Abdominal:     General: There is no distension.     Palpations: Abdomen is soft.     Tenderness: There is no abdominal tenderness. There is no guarding.  Musculoskeletal:     Comments: 5/5 strength in both upper and lower extremities  Lymphadenopathy:     Cervical: No cervical adenopathy.  Skin:    General: Skin is warm and dry.  Neurological:     Mental Status: She is alert and oriented to person, place, and time.      Deep Tendon Reflexes:     Reflex Scores:      Patellar reflexes are 2+ on the right side and 2+ on the left side. Psychiatric:        Judgment: Judgment normal.     BP 130/86 (BP Location: Right Arm, Patient Position: Sitting, Cuff  Size: Large)   Pulse 63   Temp 98.2 F (36.8 C) (Oral)   Resp 16   Ht 5' 7.5" (1.715 m)   Wt 211 lb (95.7 kg)   SpO2 100%   BMI 32.56 kg/m  Wt Readings from Last 3 Encounters:  11/01/22 211 lb (95.7 kg)  03/11/22 233 lb (105.7 kg)  02/15/22 230 lb 9.6 oz (104.6 kg)       Assessment & Plan:  Preventative health care Assessment & Plan: Wt Readings from Last 3 Encounters:  11/01/22 211 lb (95.7 kg)  03/11/22 233 lb (105.7 kg)  02/15/22 230 lb 9.6 oz (104.6 kg)   Will request mammo. Colo/pap up to date.  Encouraged her to get covid and flu boosters in the fall.   Orders: -     Lipid panel  Hypothyroidism, unspecified type Assessment & Plan: Clinically stable on synthroid. Check follow up TSH.   Orders: -     TSH  Primary hypertension Assessment & Plan: BP Readings from Last 3 Encounters:  11/01/22 130/86  03/11/22 131/81  02/15/22 (!) 140/84   BP stable. Continue HCTZ once daily.   Orders: -     Comprehensive metabolic panel  Obesity (BMI 30.0-34.9) Assessment & Plan: Her insurance is no longer covering Wegovy so she has been paying $800 cash with a coupon.  I think she should be able to get [facility] for $500/month with coupon.  Will d/c Wegovy and change to [facility].    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    Other orders -     Mounjaro; Inject 5 mg into the skin once a week.  Dispense: 2 mL; Refill: 1     I,Rachel Rivera,acting as a scribe for Lemont Fillers, NP.,have documented all relevant documentation on the behalf of Lemont Fillers, NP,as directed by  Lemont Fillers, NP while in the presence of Lemont Fillers, NP.   I, Lemont Fillers, NP, personally preformed the  services described in this documentation.  All medical record entries made by the scribe were at my direction and in my presence.  I have reviewed the chart and discharge instructions (if applicable) and agree that the record reflects my personal performance and is accurate and complete. 11/01/22   Lemont Fillers, NP

## 2022-11-01 ENCOUNTER — Encounter: Payer: Self-pay | Admitting: Family

## 2022-11-01 ENCOUNTER — Telehealth: Payer: Self-pay | Admitting: Family

## 2022-11-01 ENCOUNTER — Ambulatory Visit (INDEPENDENT_AMBULATORY_CARE_PROVIDER_SITE_OTHER): Payer: 59 | Admitting: Family

## 2022-11-01 ENCOUNTER — Other Ambulatory Visit: Payer: Self-pay | Admitting: Family

## 2022-11-01 ENCOUNTER — Telehealth: Payer: Self-pay

## 2022-11-01 ENCOUNTER — Other Ambulatory Visit (HOSPITAL_BASED_OUTPATIENT_CLINIC_OR_DEPARTMENT_OTHER): Payer: Self-pay

## 2022-11-01 VITALS — BP 130/86 | HR 63 | Temp 98.2°F | Resp 16 | Ht 67.5 in | Wt 211.0 lb

## 2022-11-01 DIAGNOSIS — E039 Hypothyroidism, unspecified: Secondary | ICD-10-CM | POA: Diagnosis not present

## 2022-11-01 DIAGNOSIS — E669 Obesity, unspecified: Secondary | ICD-10-CM | POA: Diagnosis not present

## 2022-11-01 DIAGNOSIS — I10 Essential (primary) hypertension: Secondary | ICD-10-CM

## 2022-11-01 DIAGNOSIS — F40243 Fear of flying: Secondary | ICD-10-CM

## 2022-11-01 DIAGNOSIS — Z0000 Encounter for general adult medical examination without abnormal findings: Secondary | ICD-10-CM | POA: Diagnosis not present

## 2022-11-01 MED ORDER — MOUNJARO 5 MG/0.5ML ~~LOC~~ SOAJ
5.0000 mg | SUBCUTANEOUS | 1 refills | Status: DC
  Filled 2022-11-01 – 2022-11-12 (×2): qty 2, 28d supply, fill #0
  Filled 2022-12-05: qty 2, 28d supply, fill #1

## 2022-11-01 MED ORDER — MOUNJARO 5 MG/0.5ML ~~LOC~~ SOAJ: 5.0000 mg | SUBCUTANEOUS | 1 refills | Status: DC

## 2022-11-01 NOTE — Assessment & Plan Note (Signed)
Uses xanax prn.  

## 2022-11-01 NOTE — Assessment & Plan Note (Signed)
Her insurance is no longer covering Wegovy so she has been paying $800 cash with a coupon.  I think she should be able to get [facility] for $500/month with coupon.  Will d/c Wegovy and change to [facility].

## 2022-11-01 NOTE — Assessment & Plan Note (Addendum)
Wt Readings from Last 3 Encounters:  11/01/22 211 lb (95.7 kg)  03/11/22 233 lb (105.7 kg)  02/15/22 230 lb 9.6 oz (104.6 kg)   Will request mammo. Colo/pap up to date.  Encouraged her to get covid and flu boosters in the fall.

## 2022-11-01 NOTE — Telephone Encounter (Signed)
Requesting: alprazolam Contract: No UDS: 08/06/21 Last Visit: 11/01/2022 Next Visit: 02/12/2023 Last Refill: 02/19/22  Please Advise

## 2022-11-01 NOTE — Assessment & Plan Note (Signed)
BP Readings from Last 3 Encounters:  11/01/22 130/86  03/11/22 131/81  02/15/22 (!) 140/84   BP stable. Continue HCTZ once daily.

## 2022-11-01 NOTE — Assessment & Plan Note (Signed)
Clinically stable on synthroid. Check follow up TSH.

## 2022-11-01 NOTE — Telephone Encounter (Signed)
Received PA for [facility]- Pt does not have type 2 diabetes. PA not sent.

## 2022-11-02 LAB — COMPREHENSIVE METABOLIC PANEL
AG Ratio: 1.6 (calc) (ref 1.0–2.5)
ALT: 15 U/L (ref 6–29)
AST: 14 U/L (ref 10–35)
Albumin: 4.1 g/dL (ref 3.6–5.1)
Alkaline phosphatase (APISO): 51 U/L (ref 31–125)
BUN: 13 mg/dL (ref 7–25)
CO2: 27 mmol/L (ref 20–32)
Calcium: 9.6 mg/dL (ref 8.6–10.2)
Chloride: 103 mmol/L (ref 98–110)
Creat: 0.96 mg/dL (ref 0.50–0.99)
Globulin: 2.6 g/dL (calc) (ref 1.9–3.7)
Glucose, Bld: 72 mg/dL (ref 65–99)
Potassium: 3.4 mmol/L — ABNORMAL LOW (ref 3.5–5.3)
Sodium: 140 mmol/L (ref 135–146)
Total Bilirubin: 0.3 mg/dL (ref 0.2–1.2)
Total Protein: 6.7 g/dL (ref 6.1–8.1)

## 2022-11-02 LAB — TSH: TSH: 0.88 mIU/L

## 2022-11-02 LAB — LIPID PANEL
Cholesterol: 215 mg/dL — ABNORMAL HIGH (ref <200)
HDL: 58 mg/dL (ref >=50)
LDL Cholesterol (Calc): 131 mg/dL (calc) — ABNORMAL HIGH
Non-HDL Cholesterol (Calc): 157 mg/dL (calc) — ABNORMAL HIGH (ref <130)
Total CHOL/HDL Ratio: 3.7 (calc) (ref <5.0)
Triglycerides: 148 mg/dL (ref <150)

## 2022-11-02 MED ORDER — ALPRAZOLAM 0.25 MG PO TABS: ORAL_TABLET | ORAL | 0 refills | Status: DC

## 2022-11-03 NOTE — Telephone Encounter (Signed)
Can you please call [facility] and request copy of pap?

## 2022-11-04 ENCOUNTER — Other Ambulatory Visit (HOSPITAL_BASED_OUTPATIENT_CLINIC_OR_DEPARTMENT_OTHER): Payer: Self-pay

## 2022-11-04 NOTE — Telephone Encounter (Signed)
Electronic request made 

## 2022-11-05 ENCOUNTER — Other Ambulatory Visit (HOSPITAL_BASED_OUTPATIENT_CLINIC_OR_DEPARTMENT_OTHER): Payer: Self-pay

## 2022-11-07 ENCOUNTER — Other Ambulatory Visit (HOSPITAL_BASED_OUTPATIENT_CLINIC_OR_DEPARTMENT_OTHER): Payer: Self-pay

## 2022-11-11 ENCOUNTER — Other Ambulatory Visit (HOSPITAL_BASED_OUTPATIENT_CLINIC_OR_DEPARTMENT_OTHER): Payer: Self-pay

## 2022-11-12 ENCOUNTER — Other Ambulatory Visit (HOSPITAL_BASED_OUTPATIENT_CLINIC_OR_DEPARTMENT_OTHER): Payer: Self-pay

## 2022-12-05 ENCOUNTER — Other Ambulatory Visit (HOSPITAL_BASED_OUTPATIENT_CLINIC_OR_DEPARTMENT_OTHER): Payer: Self-pay

## 2022-12-12 ENCOUNTER — Other Ambulatory Visit: Payer: Self-pay | Admitting: Family

## 2023-01-06 ENCOUNTER — Other Ambulatory Visit (HOSPITAL_BASED_OUTPATIENT_CLINIC_OR_DEPARTMENT_OTHER): Payer: Self-pay

## 2023-01-06 ENCOUNTER — Encounter (HOSPITAL_BASED_OUTPATIENT_CLINIC_OR_DEPARTMENT_OTHER): Payer: Self-pay

## 2023-01-06 ENCOUNTER — Other Ambulatory Visit: Payer: Self-pay | Admitting: Family

## 2023-01-06 MED ORDER — MOUNJARO 5 MG/0.5ML ~~LOC~~ SOAJ
5.0000 mg | SUBCUTANEOUS | 1 refills | Status: DC
  Filled 2023-01-06: qty 2, 28d supply, fill #0
  Filled 2023-01-29 – 2023-01-31 (×2): qty 2, 28d supply, fill #1

## 2023-01-07 ENCOUNTER — Other Ambulatory Visit (HOSPITAL_BASED_OUTPATIENT_CLINIC_OR_DEPARTMENT_OTHER): Payer: Self-pay

## 2023-01-08 ENCOUNTER — Other Ambulatory Visit (HOSPITAL_BASED_OUTPATIENT_CLINIC_OR_DEPARTMENT_OTHER): Payer: Self-pay

## 2023-01-22 ENCOUNTER — Encounter (INDEPENDENT_AMBULATORY_CARE_PROVIDER_SITE_OTHER): Payer: Self-pay

## 2023-01-25 ENCOUNTER — Other Ambulatory Visit: Payer: Self-pay | Admitting: Family

## 2023-01-29 ENCOUNTER — Other Ambulatory Visit (HOSPITAL_BASED_OUTPATIENT_CLINIC_OR_DEPARTMENT_OTHER): Payer: Self-pay

## 2023-02-03 ENCOUNTER — Other Ambulatory Visit (HOSPITAL_BASED_OUTPATIENT_CLINIC_OR_DEPARTMENT_OTHER): Payer: Self-pay

## 2023-02-12 ENCOUNTER — Ambulatory Visit: Payer: 59 | Admitting: Family

## 2023-02-14 ENCOUNTER — Other Ambulatory Visit (HOSPITAL_BASED_OUTPATIENT_CLINIC_OR_DEPARTMENT_OTHER): Payer: Self-pay

## 2023-02-14 ENCOUNTER — Ambulatory Visit: Payer: 59 | Admitting: Family

## 2023-02-14 VITALS — BP 126/67 | HR 65 | Temp 97.6°F | Resp 16 | Wt 207.0 lb

## 2023-02-14 DIAGNOSIS — E039 Hypothyroidism, unspecified: Secondary | ICD-10-CM | POA: Diagnosis not present

## 2023-02-14 DIAGNOSIS — F40243 Fear of flying: Secondary | ICD-10-CM | POA: Diagnosis not present

## 2023-02-14 DIAGNOSIS — E669 Obesity, unspecified: Secondary | ICD-10-CM

## 2023-02-14 DIAGNOSIS — I10 Essential (primary) hypertension: Secondary | ICD-10-CM | POA: Diagnosis not present

## 2023-02-14 LAB — BASIC METABOLIC PANEL
BUN: 14 mg/dL (ref 6–23)
CO2: 30 mEq/L (ref 19–32)
Calcium: 9.1 mg/dL (ref 8.4–10.5)
Chloride: 103 mEq/L (ref 96–112)
Creatinine, Ser: 0.99 mg/dL (ref 0.40–1.20)
GFR: 67.37 mL/min (ref >60.00)
Glucose, Bld: 85 mg/dL (ref 70–99)
Potassium: 4.1 mEq/L (ref 3.5–5.1)
Sodium: 141 mEq/L (ref 135–145)

## 2023-02-14 MED ORDER — ALPRAZOLAM 0.25 MG PO TABS
ORAL_TABLET | ORAL | 0 refills | Status: AC
  Filled 2023-02-14: qty 30, 30d supply, fill #0

## 2023-02-14 MED ORDER — SUMATRIPTAN SUCCINATE 50 MG PO TABS
ORAL_TABLET | ORAL | 1 refills | Status: AC
  Filled 2023-02-14: qty 10, 30d supply, fill #0

## 2023-02-14 MED ORDER — LEVOTHYROXINE SODIUM 137 MCG PO TABS
137.0000 ug | ORAL_TABLET | Freq: Every day | ORAL | 0 refills | Status: DC
  Filled 2023-02-14: qty 30, 30d supply, fill #0

## 2023-02-14 MED ORDER — TIRZEPATIDE 7.5 MG/0.5ML ~~LOC~~ SOAJ
7.5000 mg | SUBCUTANEOUS | 1 refills | Status: DC
  Filled 2023-02-14: qty 2, 28d supply, fill #0
  Filled 2023-03-19: qty 2, 28d supply, fill #1

## 2023-02-14 NOTE — Assessment & Plan Note (Signed)
Clinically stable on synthroid.  TSH stable. No changes.

## 2023-02-14 NOTE — Assessment & Plan Note (Signed)
Continues to lose slowly. Will increase mounjaro to 7.5mg  weekly.

## 2023-02-14 NOTE — Patient Instructions (Signed)
VISIT SUMMARY:  During your recent visit, we discussed your weight loss, migraines, and current medications. You've lost 26 pounds since September due to the use of Mounjaro, a medication you're paying for out of pocket. You also mentioned experiencing migraines once a month for the past two months, which you manage with medication. We also reviewed your current medications for hypertension, obesity, migraines, anxiety, and hypothyroidism.  YOUR PLAN:  -HYPERTENSION: Your high blood pressure is well controlled with your current medication, Hydrochlorothiazide. Continue taking this medication as prescribed.  -OBESITY: You've made significant progress in your weight loss journey. We will increase your Mounjaro dosage to 7.5mg . Please check in one month before your medication runs out so we can assess your weight loss and any potential side effects.  -MIGRAINES: You've been experiencing migraines once a month for the past two months. Continue taking your current medication as it seems to be effective. Refill your prescription as needed.  -ANXIETY: Continue using Xanax as needed for your anxiety. Refill your prescription as needed.  -HYPOTHYROIDISM: Your thyroid levels are stable with your current dose of Levothyroxine. Continue taking this medication as prescribed and refill your prescription.  -GENERAL HEALTH MAINTENANCE: Your last mammogram and colonoscopy results were normal. Your next mammogram is due in 2024 and your next colonoscopy is due in 2026. Also, it's recommended that you get a flu shot and covid booster at the pharmacy this fall.

## 2023-02-14 NOTE — Assessment & Plan Note (Signed)
BP stable on hydrochlorothiazide, continue same.

## 2023-02-14 NOTE — Assessment & Plan Note (Addendum)
Xanax refilled. Obtain UDS, contract updated.

## 2023-02-14 NOTE — Progress Notes (Signed)
Subjective:     Patient ID: Lori Stone, female    DOB: [birth year], 47 y.o.   MRN: [id number]  Chief Complaint  Patient presents with   Hypertension    Here for follow up   Hypothyroidism    Here for follow up    HPI  Discussed the use of AI scribe software for clinical note transcription with the patient, who gave verbal consent to proceed.  History of Present Illness   The patient, with a history of hypertension, migraines, and hypothyroidism, presents for a routine follow-up. She has been on a weight loss regimen with Mounjaro, which she reports has resulted in a weight reduction from 233 to 207 pounds since last September. However, she notes that the weight loss seems slower on Mounjaro compared to when she was on Wegovy. She also reports having migraines about once a month for the past two months, which she manages with imitrex. The patient also has hypothyroidism, managed with levothyroxine.      Wt Readings from Last 3 Encounters:  02/14/23 207 lb (93.9 kg)  11/01/22 211 lb (95.7 kg)  03/11/22 233 lb (105.7 kg)    Lab Results  Component Value Date   TSH 0.88 11/01/2022       Health Maintenance Due  Topic Date Due   COVID-19 Vaccine (2 - Pfizer risk series) 10/23/2019   INFLUENZA VACCINE  01/23/2023    Past Medical History:  Diagnosis Date   Anxiety    GERD (gastroesophageal reflux disease)    Hypertension    Hyperthyroidism    Recurrent streptococcal tonsillitis     Past Surgical History:  Procedure Laterality Date   DILATION AND CURETTAGE OF UTERUS  2000   lasix Bilateral 2012   radioactive iodine ablation  10/2017   Dr, Sharl Ma   TONSILLECTOMY Bilateral 10/10/2021   Procedure: TONSILLECTOMY;  Surgeon: Serena Colonel, MD;  Location: [facility];  Service: ENT;  Laterality: Bilateral;   WISDOM TOOTH EXTRACTION  1995    Family History  Problem Relation Age of Onset   Ovarian cancer Mother        dx age 27   Celiac disease Mother     Hypothyroidism Sister    Hypothyroidism Sister    Breast cancer Maternal Grandmother        dx in her late 58's   Celiac disease Other        mom's side, great aunt   Colon cancer Neg Hx     Social History   Socioeconomic History   Marital status: Married    Spouse name: Not on file   Number of children: 0   Years of education: Not on file   Highest education level: Master's degree (e.g., MA, MS, MEng, MEd, MSW, MBA)  Occupational History   Occupation: Charity fundraiser  Tobacco Use   Smoking status: Never   Smokeless tobacco: Never  Vaping Use   Vaping status: Never Used  Substance and Sexual Activity   Alcohol use: Yes    Alcohol/week: 2.0 standard drinks of alcohol    Types: 2 Standard drinks or equivalent per week    Comment: social   Drug use: No   Sexual activity: Yes    Partners: Male    Birth control/protection: I.U.D.    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   Social Determinants of Health   Financial Resource Strain: Low Risk  (02/07/2023)   Overall Financial Resource Strain (CARDIA)    Difficulty of Paying Living Expenses: Not hard at all  Food Insecurity: No Food Insecurity (02/07/2023)   Hunger Vital Sign    Worried About Running Out of Food in the Last Year: Never true    Ran Out of Food in the Last Year: Never true  Transportation Needs: No Transportation Needs (02/07/2023)   PRAPARE - Administrator, Civil Service (Medical): No    Lack of Transportation (Non-Medical): No  Physical Activity: Sufficiently Active (02/07/2023)   Exercise Vital Sign    Days of Exercise per Week: 5 days    Minutes of Exercise per Session: 30 min  Stress: No Stress Concern Present (02/07/2023)   [employer] of  Occupational Health - Occupational Stress Questionnaire    Feeling of Stress : Not at all  Social Connections: Socially Integrated (02/07/2023)   Social Connection and Isolation Panel [NHANES]    Frequency of Communication with Friends and Family: More than three times a week    Frequency of Social Gatherings with Friends and Family: Once a week    Attends Religious Services: More than 4 times per year    Active Member of [employer] or Organizations: Yes    Attends Banker Meetings: 1 to 4 times per year    Marital Status: Married  Catering manager Violence: Not on file    Outpatient Medications Prior to Visit  Medication Sig Dispense Refill   hydrochlorothiazide (HYDRODIURIL) 25 MG tablet TAKE 1 TABLET BY MOUTH DAILY 90 tablet 0   ALPRAZolam (XANAX) 0.25 MG tablet TAKE 1 TABLET BY MOUTH AS NEEDED BEFORE FLYING 30 tablet 0   levothyroxine (SYNTHROID) 137 MCG tablet Take 1 tablet (137 mcg total) by mouth daily before breakfast. 90 tablet 0   SUMAtriptan (IMITREX) 50 MG tablet Take 1 tablet by mouth as needed for migraine, may repeat in 2 hrs if needed. 10 tablet 1   tirzepatide (MOUNJARO) 5 MG/0.5ML Pen Inject 5 mg into the skin once a week. 2 mL 1   No facility-administered medications prior to visit.    Allergies  Allergen Reactions   Methimazole Itching and Rash   Propylthiouracil Hives and Itching   Contrave [Naltrexone-Bupropion Hcl Er]     Extreme nausea    ROS     Objective:    Physical Exam Constitutional:      General: She is not in acute distress.    Appearance: Normal appearance. She is well-developed.  HENT:     Head: Normocephalic and atraumatic.     Right Ear: External ear normal.     Left Ear: External ear normal.  Eyes:     General: No scleral icterus. Neck:     Thyroid: No thyroid mass or thyromegaly.  Cardiovascular:     Rate and Rhythm: Normal rate and regular rhythm.     Heart sounds: Normal heart sounds. No murmur heard. Pulmonary:      Effort: Pulmonary effort is normal. No respiratory distress.     Breath sounds: Normal breath sounds. No wheezing.  Musculoskeletal:     Cervical back: Neck supple.  Lymphadenopathy:     Cervical: No cervical adenopathy.  Skin:    General: Skin is warm and dry.  Neurological:     Mental Status: She is alert and oriented to person, place, and time.  Psychiatric:        Mood and Affect: Mood normal.        Behavior: Behavior normal.        Thought Content: Thought content normal.        Judgment: Judgment normal.      BP 126/67 (BP Location: Right Arm, Patient Position: Sitting, Cuff Size: Large)   Pulse 65   Temp 97.6 F (36.4 C) (Oral)   Resp 16   Wt 207 lb (93.9 kg)   SpO2 (!) 10%   BMI 31.94 kg/m  Wt Readings from Last 3 Encounters:  02/14/23 207 lb (93.9 kg)  11/01/22 211 lb (95.7 kg)  03/11/22 233 lb (105.7 kg)       Assessment & Plan:   Problem List Items Addressed This Visit       Unprioritized   Primary hypertension    BP stable on hydrochlorothiazide, continue same.      Obesity (BMI 30.0-34.9)    Continues to lose slowly. Will increase mounjaro to 7.5mg  weekly.       Hypothyroidism    Clinically stable on synthroid.  TSH stable. No changes.       Relevant Medications   levothyroxine (SYNTHROID) 137 MCG tablet   Fear of flying    Xanax refilled. Obtain UDS, contract updated.       Relevant Medications   ALPRAZolam (XANAX) 0.25 MG tablet   Other Relevant Orders   DRUG MONITORING, PANEL 8 WITH CONFIRMATION, URINE   Other Visit Diagnoses     Essential hypertension    -  Primary   Relevant Orders   Basic Metabolic Panel (BMET)       I have discontinued Donnie Aho. I am also having her start on tirzepatide. Additionally, I am having her maintain her hydrochlorothiazide, SUMAtriptan, levothyroxine, and ALPRAZolam.  Meds ordered this encounter  Medications   tirzepatide (MOUNJARO) 7.5 MG/0.5ML Pen    Sig: Inject 7.5 mg into  the skin once a week.    Dispense:  2 mL    Refill:  1    Order Specific Question:   Supervising Provider    Answer:   Danise Edge A [[id number]]   SUMAtriptan (IMITREX) 50 MG tablet    Sig: Take 1 tablet by mouth as needed for migraine, may repeat in 2 hrs if needed.    Dispense:  10 tablet    Refill:  1    Order Specific Question:   Supervising Provider    Answer:   Danise Edge A [[id number]]   levothyroxine (SYNTHROID) 137 MCG tablet    Sig: Take 1 tablet (137 mcg total) by mouth daily before breakfast.    Dispense:  90 tablet    Refill:  0    Order Specific Question:   Supervising Provider    Answer:   Danise Edge A [[id number]]   ALPRAZolam (XANAX) 0.25 MG tablet    Sig: TAKE 1 TABLET BY MOUTH AS NEEDED BEFORE FLYING    Dispense:  30 tablet    Refill:  0    Not to exceed 5 additional fills before 10/15/2017    Order Specific Question:   Supervising Provider    Answer:   Danise Edge A [[id number]]

## 2023-02-15 LAB — DRUG MONITORING, PANEL 8 WITH CONFIRMATION, URINE
6 Acetylmorphine: NEGATIVE ng/mL (ref <10)
Alcohol Metabolites: NEGATIVE ng/mL (ref <500)
Amphetamines: NEGATIVE ng/mL (ref <500)
Benzodiazepines: NEGATIVE ng/mL (ref <100)
Buprenorphine, Urine: NEGATIVE ng/mL (ref <5)
Cocaine Metabolite: NEGATIVE ng/mL (ref <150)
Creatinine: 34.6 mg/dL (ref >=20.0)
MDMA: NEGATIVE ng/mL (ref <500)
Marijuana Metabolite: NEGATIVE ng/mL (ref <20)
Opiates: NEGATIVE ng/mL (ref <100)
Oxidant: NEGATIVE ug/mL (ref <200)
Oxycodone: NEGATIVE ng/mL (ref <100)
pH: 7.6 (ref 4.5–9.0)

## 2023-02-15 LAB — DM TEMPLATE

## 2023-03-11 ENCOUNTER — Encounter: Payer: Self-pay | Admitting: Family

## 2023-03-11 MED ORDER — LEVOTHYROXINE SODIUM 137 MCG PO TABS: 137.0000 ug | ORAL_TABLET | Freq: Every day | ORAL | 0 refills | Status: DC

## 2023-03-19 ENCOUNTER — Other Ambulatory Visit (HOSPITAL_BASED_OUTPATIENT_CLINIC_OR_DEPARTMENT_OTHER): Payer: Self-pay

## 2023-03-28 ENCOUNTER — Encounter: Payer: Self-pay | Admitting: Family

## 2023-03-28 MED ORDER — TIRZEPATIDE 7.5 MG/0.5ML ~~LOC~~ SOAJ: 7.5000 mg | SUBCUTANEOUS | 0 refills | Status: AC

## 2023-04-18 ENCOUNTER — Encounter: Payer: Self-pay | Admitting: Family

## 2023-04-20 MED ORDER — LEVOTHYROXINE SODIUM 137 MCG PO TABS: 137.0000 ug | ORAL_TABLET | Freq: Every day | ORAL | 0 refills | Status: DC

## 2023-04-23 ENCOUNTER — Other Ambulatory Visit (HOSPITAL_COMMUNITY): Payer: Self-pay

## 2023-04-25 ENCOUNTER — Other Ambulatory Visit: Payer: Self-pay | Admitting: Family

## 2023-05-05 ENCOUNTER — Encounter: Payer: Self-pay | Admitting: Family

## 2023-05-05 MED ORDER — LEVOTHYROXINE SODIUM 137 MCG PO TABS: 137.0000 ug | ORAL_TABLET | Freq: Every day | ORAL | 0 refills | Status: DC

## 2023-05-31 ENCOUNTER — Other Ambulatory Visit: Payer: Self-pay | Admitting: Family

## 2023-06-05 ENCOUNTER — Other Ambulatory Visit: Payer: Self-pay

## 2023-06-05 MED ORDER — LEVOTHYROXINE SODIUM 137 MCG PO TABS: 137.0000 ug | ORAL_TABLET | Freq: Every day | ORAL | 0 refills | Status: DC

## 2023-06-14 ENCOUNTER — Encounter: Payer: Self-pay | Admitting: Family

## 2023-06-16 MED ORDER — HYDROCHLOROTHIAZIDE 25 MG PO TABS: 25.0000 mg | ORAL_TABLET | Freq: Every day | ORAL | 0 refills | Status: AC

## 2023-07-05 ENCOUNTER — Other Ambulatory Visit: Payer: Self-pay | Admitting: Family

## 2023-07-25 ENCOUNTER — Other Ambulatory Visit: Payer: Self-pay | Admitting: Family
